# Patient Record
Sex: Male | Born: 1975 | Race: Asian | Hispanic: No | Marital: Married | Smoking: Never smoker
Health system: Southern US, Community
[De-identification: ages and names within clinical notes are randomized; demographics above are authoritative.]

## PROBLEM LIST (undated history)

## (undated) DIAGNOSIS — R7989 Other specified abnormal findings of blood chemistry: Secondary | ICD-10-CM

## (undated) DIAGNOSIS — K219 Gastro-esophageal reflux disease without esophagitis: Secondary | ICD-10-CM

## (undated) DIAGNOSIS — K602 Anal fissure, unspecified: Secondary | ICD-10-CM

## (undated) DIAGNOSIS — E785 Hyperlipidemia, unspecified: Secondary | ICD-10-CM

## (undated) HISTORY — DX: Anal fissure, unspecified: K60.2

## (undated) HISTORY — PX: ESOPHAGOGASTRODUODENOSCOPY: SHX1529

## (undated) HISTORY — PX: NO PAST SURGERIES: SHX2092

## (undated) HISTORY — DX: Other specified abnormal findings of blood chemistry: R79.89

## (undated) HISTORY — DX: Hyperlipidemia, unspecified: E78.5

---

## 2018-02-25 ENCOUNTER — Ambulatory Visit (INDEPENDENT_AMBULATORY_CARE_PROVIDER_SITE_OTHER): Payer: 59 | Admitting: Medical

## 2018-02-25 ENCOUNTER — Encounter: Payer: Self-pay | Admitting: Medical

## 2018-02-25 VITALS — BP 125/90 | HR 72 | Temp 98.2°F | Resp 18 | Ht 66.0 in | Wt 151.6 lb

## 2018-02-25 DIAGNOSIS — Z113 Encounter for screening for infections with a predominantly sexual mode of transmission: Secondary | ICD-10-CM | POA: Diagnosis not present

## 2018-02-25 DIAGNOSIS — E785 Hyperlipidemia, unspecified: Secondary | ICD-10-CM | POA: Diagnosis not present

## 2018-02-25 DIAGNOSIS — E559 Vitamin D deficiency, unspecified: Secondary | ICD-10-CM

## 2018-02-25 DIAGNOSIS — R5383 Other fatigue: Secondary | ICD-10-CM | POA: Diagnosis not present

## 2018-02-25 DIAGNOSIS — Z0000 Encounter for general adult medical examination without abnormal findings: Secondary | ICD-10-CM

## 2018-02-25 NOTE — Patient Instructions (Addendum)
For you wellness exam today I have ordered cbc, cmp, tsh, lipid panel, vit d, ua and hiv.  Vaccine up to date.  Recommend exercise and healthy diet.  We will let you know lab results as they come in.  Follow up date appointment will be determined after lab review.     Preventive Care 40-64 Years, Male Preventive care refers to lifestyle choices and visits with your health care provider that can promote health and wellness. What does preventive care include?   A yearly physical exam. This is also called an annual well check.  Dental exams once or twice a year.  Routine eye exams. Ask your health care provider how often you should have your eyes checked.  Personal lifestyle choices, including: ? Daily care of your teeth and gums. ? Regular physical activity. ? Eating a healthy diet. ? Avoiding tobacco and drug use. ? Limiting alcohol use. ? Practicing safe sex. ? Taking low-dose aspirin every day starting at age 30. What happens during an annual well check? The services and screenings done by your health care provider during your annual well check will depend on your age, overall health, lifestyle risk factors, and family history of disease. Counseling Your health care provider may ask you questions about your:  Alcohol use.  Tobacco use.  Drug use.  Emotional well-being.  Home and relationship well-being.  Sexual activity.  Eating habits.  Work and work Statistician. Screening You may have the following tests or measurements:  Height, weight, and BMI.  Blood pressure.  Lipid and cholesterol levels. These may be checked every 5 years, or more frequently if you are over 65 years old.  Skin check.  Lung cancer screening. You may have this screening every year starting at age 62 if you have a 30-pack-year history of smoking and currently smoke or have quit within the past 15 years.  Colorectal cancer screening. All adults should have this screening starting  at age 25 and continuing until age 74. Your health care provider may recommend screening at age 34. You will have tests every 1-10 years, depending on your results and the type of screening test. People at increased risk should start screening at an earlier age. Screening tests may include: ? Guaiac-based fecal occult blood testing. ? Fecal immunochemical test (FIT). ? Stool DNA test. ? Virtual colonoscopy. ? Sigmoidoscopy. During this test, a flexible tube with a tiny camera (sigmoidoscope) is used to examine your rectum and lower colon. The sigmoidoscope is inserted through your anus into your rectum and lower colon. ? Colonoscopy. During this test, a long, thin, flexible tube with a tiny camera (colonoscope) is used to examine your entire colon and rectum.  Prostate cancer screening. Recommendations will vary depending on your family history and other risks.  Hepatitis C blood test.  Hepatitis B blood test.  Sexually transmitted disease (STD) testing.  Diabetes screening. This is done by checking your blood sugar (glucose) after you have not eaten for a while (fasting). You may have this done every 1-3 years. Discuss your test results, treatment options, and if necessary, the need for more tests with your health care provider. Vaccines Your health care provider may recommend certain vaccines, such as:  Influenza vaccine. This is recommended every year.  Tetanus, diphtheria, and acellular pertussis (Tdap, Td) vaccine. You may need a Td booster every 10 years.  Varicella vaccine. You may need this if you have not been vaccinated.  Zoster vaccine. You may need this after age 35.  Measles, mumps, and rubella (MMR) vaccine. You may need at least one dose of MMR if you were born in 1957 or later. You may also need a second dose.  Pneumococcal 13-valent conjugate (PCV13) vaccine. You may need this if you have certain conditions and have not been vaccinated.  Pneumococcal polysaccharide  (PPSV23) vaccine. You may need one or two doses if you smoke cigarettes or if you have certain conditions.  Meningococcal vaccine. You may need this if you have certain conditions.  Hepatitis A vaccine. You may need this if you have certain conditions or if you travel or work in places where you may be exposed to hepatitis A.  Hepatitis B vaccine. You may need this if you have certain conditions or if you travel or work in places where you may be exposed to hepatitis B.  Haemophilus influenzae type b (Hib) vaccine. You may need this if you have certain risk factors. Talk to your health care provider about which screenings and vaccines you need and how often you need them. This information is not intended to replace advice given to you by your health care provider. Make sure you discuss any questions you have with your health care provider. Document Released: 01/22/2015 Document Revised: 02/15/2017 Document Reviewed: 10/27/2014 Elsevier Interactive Patient Education  2019 [employer].

## 2018-02-25 NOTE — Progress Notes (Signed)
Subjective:    Patient ID: Johnny Riggs, male    DOB: 09-Mar-1975, 43 y.o.   MRN: [id number]  HPI  Pt in for first time.  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 Pt does exercise regularly 3-4 times a week 30 minutes at time.He eats healthy.   Pt states his ldl has been mildly high in past. He would like to get that studies done   Pt also has history of low vitamin D. Pt takes otc vit D gummy a day.   No other PMH other than cholesterol and low vitamin D history.   Review of Systems  Constitutional: Positive for fatigue. Negative for chills and fever.  HENT: Negative for congestion, ear discharge, ear pain, facial swelling, postnasal drip, sinus pressure and sinus pain.   Respiratory: Negative for cough, chest tightness, shortness of breath and wheezing.   Cardiovascular: Negative for chest pain and palpitations.  Gastrointestinal: Negative for abdominal pain, constipation, diarrhea and vomiting.  Genitourinary: Negative for decreased urine volume, difficulty urinating, dysuria, flank pain, frequency, penile swelling and urgency.  Musculoskeletal: Negative for back pain, joint swelling and neck stiffness.  Skin: Negative for rash.  Neurological: Negative for dizziness, speech difficulty, weakness and headaches.  Hematological: Negative for adenopathy. Does not bruise/bleed easily.  Psychiatric/Behavioral: Negative for behavioral problems, confusion and dysphoric mood.    No past medical history on file.   Social History   Socioeconomic History  . Marital status: Married    Spouse name: Not on file  . Number of children: Not on file  . Years of education: Not on file  . Highest education level: Not on file  Occupational History  . Not on file  Social Needs  . Financial resource strain: Not on file  . Food insecurity:    Worry: Not on file    Inability: Not on file  . Transportation needs:    Medical: Not on file    Non-medical: Not on file  Tobacco Use  . Smoking  status: Not on file  Substance and Sexual Activity  . Alcohol use: Not on file  . Drug use: Not on file  . Sexual activity: Not on file  Lifestyle  . Physical activity:    Days per week: Not on file    Minutes per session: Not on file  . Stress: Not on file  Relationships  . Social connections:    Talks on phone: Not on file    Gets together: Not on file    Attends religious service: Not on file    Active member of club or organization: Not on file    Attends meetings of clubs or organizations: Not on file    Relationship status: Not on file  . Intimate partner violence:    Fear of current or ex partner: Not on file    Emotionally abused: Not on file    Physically abused: Not on file    Forced sexual activity: Not on file  Other Topics Concern  . Not on file  Social History Narrative  . Not on file    No family history on file.  Allergies not on file  No current outpatient medications on file prior to visit.   No current facility-administered medications on file prior to visit.     BP 125/90 (BP Location: Left Arm, Patient Position: Sitting, Cuff Size: Normal)   Pulse 72   Temp 98.2 F (36.8 C) (Oral)   Resp 18   Ht 5'  6" (1.676 m)   Wt 151 lb 9.6 oz (68.8 kg)   SpO2 100%   BMI 24.47 kg/m       Objective:   Physical Exam  General Mental Status- Alert. General Appearance- Not in acute distress.   Skin General: Color- Normal Color. Moisture- Normal Moisture. Very few small scattered moles on anterior and posterior thorax.  One skin tag left of mid thoracic region.(Patient states is been there for years since youth.)  Neck Normal range of motion.  No lymphadenopathy.  No supraclavicular nodes felt.  Chest and Lung Exam Auscultation: Breath Sounds:-Normal.  Cardiovascular Auscultation:Rythm- Regular. Murmurs & Other Heart Sounds:Auscultation of the heart reveals- No Murmurs.  Abdomen Inspection:-Inspeection Normal. Palpation/Percussion:Note:No  mass. Palpation and Percussion of the abdomen reveal- Non Tender, Non Distended + BS, no rebound or guarding.  Neurologic Cranial Nerve exam:- CN III-XII intact(No nystagmus), symmetric smile. Strength:- 5/5 equal and symmetric strength both upper and lower extremities.    Assessment & Plan:  For you wellness exam today I have ordered cbc, cmp, tsh, lipid panel, vitamin  ua and hiv.(Explained to patient to schedule appointment for tomorrow morning to get labs since he was not fasting today.)  Vaccine up to date.  Recommend exercise and healthy diet.  We will let you know lab results as they come in.  Follow up date appointment will be determined after lab review.

## 2018-02-26 ENCOUNTER — Other Ambulatory Visit (INDEPENDENT_AMBULATORY_CARE_PROVIDER_SITE_OTHER): Payer: 59

## 2018-02-26 DIAGNOSIS — Z113 Encounter for screening for infections with a predominantly sexual mode of transmission: Secondary | ICD-10-CM

## 2018-02-26 DIAGNOSIS — Z0000 Encounter for general adult medical examination without abnormal findings: Secondary | ICD-10-CM

## 2018-02-26 DIAGNOSIS — E559 Vitamin D deficiency, unspecified: Secondary | ICD-10-CM | POA: Diagnosis not present

## 2018-02-26 DIAGNOSIS — R5383 Other fatigue: Secondary | ICD-10-CM

## 2018-02-26 LAB — TSH: TSH: 1.9 u[IU]/mL (ref 0.35–4.50)

## 2018-02-26 LAB — POC URINALSYSI DIPSTICK (AUTOMATED)
Bilirubin, UA: NEGATIVE
Blood, UA: NEGATIVE
Glucose, UA: NEGATIVE
Ketones, UA: NEGATIVE
Leukocytes, UA: NEGATIVE
Nitrite, UA: NEGATIVE
PH UA: 6 (ref 5.0–8.0)
PROTEIN UA: NEGATIVE
Spec Grav, UA: 1.015 (ref 1.010–1.025)
Urobilinogen, UA: 0.2 E.U./dL

## 2018-02-26 LAB — LIPID PANEL
Cholesterol: 194 mg/dL (ref 0–200)
HDL: 36.7 mg/dL — ABNORMAL LOW (ref >39.00)
LDL Cholesterol: 135 mg/dL — ABNORMAL HIGH (ref 0–99)
NonHDL: 157.54
Total CHOL/HDL Ratio: 5
Triglycerides: 112 mg/dL (ref 0.0–149.0)
VLDL: 22.4 mg/dL (ref 0.0–40.0)

## 2018-02-26 LAB — CBC WITH DIFFERENTIAL/PLATELET
BASOS ABS: 0 10*3/uL (ref 0.0–0.1)
Basophils Relative: 0.8 % (ref 0.0–3.0)
Eosinophils Absolute: 0.1 10*3/uL (ref 0.0–0.7)
Eosinophils Relative: 1.4 % (ref 0.0–5.0)
HCT: 45 % (ref 39.0–52.0)
Hemoglobin: 15 g/dL (ref 13.0–17.0)
Lymphocytes Relative: 25.5 % (ref 12.0–46.0)
Lymphs Abs: 1.5 10*3/uL (ref 0.7–4.0)
MCHC: 33.3 g/dL (ref 30.0–36.0)
MCV: 87.6 fl (ref 78.0–100.0)
MONO ABS: 0.4 10*3/uL (ref 0.1–1.0)
Monocytes Relative: 7.3 % (ref 3.0–12.0)
Neutro Abs: 3.7 10*3/uL (ref 1.4–7.7)
Neutrophils Relative %: 65 % (ref 43.0–77.0)
Platelets: 287 10*3/uL (ref 150.0–400.0)
RBC: 5.14 Mil/uL (ref 4.22–5.81)
RDW: 12.9 % (ref 11.5–15.5)
WBC: 5.7 10*3/uL (ref 4.0–10.5)

## 2018-02-26 LAB — COMPREHENSIVE METABOLIC PANEL
ALT: 12 U/L (ref 0–53)
AST: 14 U/L (ref 0–37)
Albumin: 4.4 g/dL (ref 3.5–5.2)
Alkaline Phosphatase: 74 U/L (ref 39–117)
BUN: 17 mg/dL (ref 6–23)
CO2: 32 meq/L (ref 19–32)
Calcium: 9.5 mg/dL (ref 8.4–10.5)
Chloride: 104 mEq/L (ref 96–112)
Creatinine, Ser: 0.92 mg/dL (ref 0.40–1.50)
GFR: 89.2 mL/min (ref >60.00)
Glucose, Bld: 90 mg/dL (ref 70–99)
Potassium: 4.4 mEq/L (ref 3.5–5.1)
Sodium: 144 mEq/L (ref 135–145)
Total Bilirubin: 0.8 mg/dL (ref 0.2–1.2)
Total Protein: 6.9 g/dL (ref 6.0–8.3)

## 2018-02-26 LAB — VITAMIN D 25 HYDROXY (VIT D DEFICIENCY, FRACTURES): VITD: 18.54 ng/mL — ABNORMAL LOW (ref 30.00–100.00)

## 2018-02-27 ENCOUNTER — Encounter: Payer: Self-pay | Admitting: Medical

## 2018-02-27 LAB — HIV ANTIBODY (ROUTINE TESTING W REFLEX): HIV: NONREACTIVE

## 2018-03-08 ENCOUNTER — Ambulatory Visit: Payer: Self-pay | Admitting: Surgery

## 2018-03-08 ENCOUNTER — Encounter (HOSPITAL_BASED_OUTPATIENT_CLINIC_OR_DEPARTMENT_OTHER): Payer: Self-pay | Admitting: *Deleted

## 2018-03-08 ENCOUNTER — Other Ambulatory Visit: Payer: Self-pay

## 2018-03-08 DIAGNOSIS — N5089 Other specified disorders of the male genital organs: Secondary | ICD-10-CM | POA: Diagnosis not present

## 2018-03-08 NOTE — H&P (Signed)
Surgical H&P  CC: perineal cyst  HPI: 43 year old male with history of hyperlipidemia and low vitamin D, who is self-referred for a lesion on the right medial buttock/ perineum. He states that there has been a cyst in this location for several years which would occasionally become swollen and painful about the dimensions of the peanut, but he felt that it was a benign lesion and therefore did not pay much attention to it. For the last month or so, it has been increasingly frequently flaring and intermittently draining in addition to which this area causes him some pruritus. He is interested in options for excision. No prior perianal procedures.  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 Denies tobacco, alcohol or drug use.  Recent labs for physical were unremarkable aside from previously mentioned elevated LDL and low vitamin D.  NKDA  Past Medical History:  Diagnosis Date  . Hyperlipidemia   . Low vitamin D level     No past surgical history on file.  No family history on file.  Social History   Socioeconomic History  . Marital status: Married    Spouse name: Not on file  . Number of children: Not on file  . Years of education: Not on file  . Highest education level: Not on file  Occupational History  . Occupation: hospitalist  Social Needs  . Financial resource strain: Not on file  . Food insecurity:    Worry: Not on file    Inability: Not on file  . Transportation needs:    Medical: Not on file    Non-medical: Not on file  Tobacco Use  . Smoking status: Never Smoker  . Smokeless tobacco: Never Used  Substance and Sexual Activity  . Alcohol use: Not Currently  . Drug use: Never  . Sexual activity: Yes  Lifestyle  . Physical activity:    Days per week: Not on file    Minutes per session: Not on file  . Stress: Not on file  Relationships  . Social connections:    Talks on phone: Not on file    Gets together: Not on file    Attends religious service: Not on file   Active member of club or organization: Not on file    Attends meetings of clubs or organizations: Not on file    Relationship status: Not on file  Other Topics Concern  . Not on file  Social History Narrative  . Not on file    No current outpatient medications on file prior to visit.   No current facility-administered medications on file prior to visit.     Review of Systems: a complete, 10pt review of systems was completed with pertinent positives and negatives as documented in the HPI  Physical Exam:  03/08/2018 2:38 PM Weight: 151 lb Height: 66in Body Surface Area: 1.77 m Body Mass Index: 24.37 kg/m  Temp.: 98.67F  Pulse: 84 (Regular)  BP: 122/84 (Sitting, Left Arm, Standard)  Gen: alert and well appearing Eye: extraocular motion intact, no scleral icterus ENT: moist mucus membranes, dentition intact Chest: unlabored respirations Neuro: grossly intact, normal gait Psych: normal mood and affect, appropriate insight Skin: warm and dry Rectal: On the right side of the perineum just posterior to the perineal body there is a approximately 7 mm area of flat pink scar, no palpable underlying cyst or fistulous tract*   CBC Latest Ref Rng & Units 02/26/2018  WBC 4.0 - 10.5 K/uL 5.7  Hemoglobin 13.0 - 17.0 g/dL 15.0  Hematocrit  39.0 - 52.0 % 45.0  Platelets 150.0 - 400.0 K/uL 287.0    CMP Latest Ref Rng & Units 02/26/2018  Glucose 70 - 99 mg/dL 90  BUN 6 - 23 mg/dL 17  Creatinine 0.40 - 1.50 mg/dL 0.92  Sodium 135 - 145 mEq/L 144  Potassium 3.5 - 5.1 mEq/L 4.4  Chloride 96 - 112 mEq/L 104  CO2 19 - 32 mEq/L 32  Calcium 8.4 - 10.5 mg/dL 9.5  Total Protein 6.0 - 8.3 g/dL 6.9  Total Bilirubin 0.2 - 1.2 mg/dL 0.8  Alkaline Phos 39 - 117 U/L 74  AST 0 - 37 U/L 14  ALT 0 - 53 U/L 12    No results found for: INR, PROTIME  Imaging: No results found.    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 We discussed  risks of bleeding, infection, pain, scarring, wound healing problems, and recurrence. Questions were welcomed answered to his satisfaction. Will plan to schedule excision under MAC.   Romana Juniper, MD [facility], [place] Pager [phone]

## 2018-03-08 NOTE — H&P (View-Only) (Signed)
Surgical H&P  CC: perineal cyst  HPI: 43-year-old male with history of hyperlipidemia and low vitamin D, who is self-referred for a lesion on the right medial buttock/ perineum. He states that there has been a cyst in this location for several years which would occasionally become swollen and painful about the dimensions of the peanut, but he felt that it was a benign lesion and therefore did not pay much attention to it. For the last month or so, it has been increasingly frequently flaring and intermittently draining in addition to which this area causes him some pruritus. He is interested in options for excision. No prior perianal procedures.  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  Recent labs for physical were unremarkable aside from previously mentioned elevated LDL and low vitamin D.  NKDA  Past Medical History:  Diagnosis Date  . Hyperlipidemia   . Low vitamin D level     No past surgical history on file.  No family history on file.  Social History   Socioeconomic History  . Marital status: Married    Spouse name: Not on file  . Number of children: Not on file  . Years of education: Not on file  . Highest education level: Not on file  Occupational History  . Occupation: hospitalist  Social Needs  . Financial resource strain: Not on file  . Food insecurity:    Worry: Not on file    Inability: Not on file  . Transportation needs:    Medical: Not on file    Non-medical: Not on file  Tobacco Use  . Smoking status: Never Smoker  . Smokeless tobacco: Never Used  Substance and Sexual Activity  . Alcohol use: Not Currently  . Drug use: Never  . Sexual activity: Yes  Lifestyle  . Physical activity:    Days per week: Not on file    Minutes per session: Not on file  . Stress: Not on file  Relationships  . Social connections:    Talks on phone: Not on file    Gets together: Not on file    Attends religious service: Not on file   Active member of club or organization: Not on file    Attends meetings of clubs or organizations: Not on file    Relationship status: Not on file  Other Topics Concern  . Not on file  Social History Narrative  . Not on file    No current outpatient medications on file prior to visit.   No current facility-administered medications on file prior to visit.     Review of Systems: a complete, 10pt review of systems was completed with pertinent positives and negatives as documented in the HPI  Physical Exam:  03/08/2018 2:38 PM Weight: 151 lb Height: 66in Body Surface Area: 1.77 m Body Mass Index: 24.37 kg/m  Temp.: 98.1F  Pulse: 84 (Regular)  BP: 122/84 (Sitting, Left Arm, Standard)  Gen: alert and well appearing Eye: extraocular motion intact, no scleral icterus ENT: moist mucus membranes, dentition intact Chest: unlabored respirations Neuro: grossly intact, normal gait Psych: normal mood and affect, appropriate insight Skin: warm and dry Rectal: On the right side of the perineum just posterior to the perineal body there is a approximately 7 mm area of flat pink scar, no palpable underlying cyst or fistulous tract*   CBC Latest Ref Rng & Units 02/26/2018  WBC 4.0 - 10.5 K/uL 5.7  Hemoglobin 13.0 - 17.0 g/dL 15.0  Hematocrit   39.0 - 52.0 % 45.0  Platelets 150.0 - 400.0 K/uL 287.0    CMP Latest Ref Rng & Units 02/26/2018  Glucose 70 - 99 mg/dL 90  BUN 6 - 23 mg/dL 17  Creatinine 0.40 - 1.50 mg/dL 0.92  Sodium 135 - 145 mEq/L 144  Potassium 3.5 - 5.1 mEq/L 4.4  Chloride 96 - 112 mEq/L 104  CO2 19 - 32 mEq/L 32  Calcium 8.4 - 10.5 mg/dL 9.5  Total Protein 6.0 - 8.3 g/dL 6.9  Total Bilirubin 0.2 - 1.2 mg/dL 0.8  Alkaline Phos 39 - 117 U/L 74  AST 0 - 37 U/L 14  ALT 0 - 53 U/L 12    No results found for: INR, PROTIME  Imaging: No results found.    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 We discussed  risks of bleeding, infection, pain, scarring, wound healing problems, and recurrence. Questions were welcomed answered to his satisfaction. Will plan to schedule excision under MAC.   Ramah Langhans, MD [facility] [place] Pager [phone]  

## 2018-03-11 ENCOUNTER — Encounter (HOSPITAL_BASED_OUTPATIENT_CLINIC_OR_DEPARTMENT_OTHER)
Admission: RE | Disposition: A | Discharge status: Home / Self Care | Payer: Self-pay | Source: Home / Self Care | Attending: Surgery

## 2018-03-11 ENCOUNTER — Ambulatory Visit (HOSPITAL_BASED_OUTPATIENT_CLINIC_OR_DEPARTMENT_OTHER)
Admission: RE | Admit: 2018-03-11 | Discharge: 2018-03-11 | Disposition: A | Discharge status: Home / Self Care | Payer: 59 | Attending: Surgery | Admitting: Surgery

## 2018-03-11 ENCOUNTER — Ambulatory Visit (HOSPITAL_BASED_OUTPATIENT_CLINIC_OR_DEPARTMENT_OTHER): Payer: 59 | Admitting: Anesthesiology

## 2018-03-11 ENCOUNTER — Other Ambulatory Visit: Payer: Self-pay

## 2018-03-11 ENCOUNTER — Encounter (HOSPITAL_BASED_OUTPATIENT_CLINIC_OR_DEPARTMENT_OTHER): Payer: Self-pay | Admitting: Anesthesiology

## 2018-03-11 DIAGNOSIS — N907 Vulvar cyst: Secondary | ICD-10-CM | POA: Diagnosis not present

## 2018-03-11 DIAGNOSIS — L02215 Cutaneous abscess of perineum: Secondary | ICD-10-CM | POA: Insufficient documentation

## 2018-03-11 DIAGNOSIS — E785 Hyperlipidemia, unspecified: Secondary | ICD-10-CM | POA: Insufficient documentation

## 2018-03-11 DIAGNOSIS — L728 Other follicular cysts of the skin and subcutaneous tissue: Secondary | ICD-10-CM | POA: Diagnosis present

## 2018-03-11 HISTORY — PX: CYST EXCISION: SHX5701

## 2018-03-11 SURGERY — CYST REMOVAL
Anesthesia: Monitor Anesthesia Care | Site: Groin

## 2018-03-11 MED ORDER — FENTANYL CITRATE (PF) 100 MCG/2ML IJ SOLN: 25.0000 ug | INTRAMUSCULAR | Status: DC | PRN

## 2018-03-11 MED ORDER — PROPOFOL 500 MG/50ML IV EMUL
INTRAVENOUS | Status: AC
  Filled 2018-03-11: qty 50

## 2018-03-11 MED ORDER — LACTATED RINGERS IV SOLN
INTRAVENOUS | Status: DC
  Administered 2018-03-11: 09:00:00 via INTRAVENOUS

## 2018-03-11 MED ORDER — TRAMADOL HCL 50 MG PO TABS: 50.0000 mg | ORAL_TABLET | Freq: Four times a day (QID) | ORAL | 0 refills | Status: DC | PRN

## 2018-03-11 MED ORDER — CHLORHEXIDINE GLUCONATE 4 % EX LIQD: 60.0000 mL | Freq: Once | CUTANEOUS | Status: DC

## 2018-03-11 MED ORDER — MIDAZOLAM HCL 2 MG/2ML IJ SOLN
INTRAMUSCULAR | Status: AC
  Filled 2018-03-11: qty 2

## 2018-03-11 MED ORDER — SODIUM CHLORIDE 0.9% FLUSH: 3.0000 mL | INTRAVENOUS | Status: DC | PRN

## 2018-03-11 MED ORDER — ACETAMINOPHEN 500 MG PO TABS
ORAL_TABLET | ORAL | Status: AC
  Filled 2018-03-11: qty 2

## 2018-03-11 MED ORDER — PROPOFOL 500 MG/50ML IV EMUL
INTRAVENOUS | Status: DC | PRN
  Administered 2018-03-11: 100 ug/kg/min via INTRAVENOUS

## 2018-03-11 MED ORDER — MEPERIDINE HCL 25 MG/ML IJ SOLN: 6.2500 mg | INTRAMUSCULAR | Status: DC | PRN

## 2018-03-11 MED ORDER — SODIUM CHLORIDE 0.9% FLUSH: 3.0000 mL | Freq: Two times a day (BID) | INTRAVENOUS | Status: DC

## 2018-03-11 MED ORDER — BUPIVACAINE HCL (PF) 0.25 % IJ SOLN
INTRAMUSCULAR | Status: DC | PRN
  Administered 2018-03-11: 6 mL

## 2018-03-11 MED ORDER — LIDOCAINE HCL (PF) 1 % IJ SOLN
INTRAMUSCULAR | Status: AC
  Filled 2018-03-11: qty 30

## 2018-03-11 MED ORDER — CEFAZOLIN SODIUM-DEXTROSE 2-4 GM/100ML-% IV SOLN
2.0000 g | INTRAVENOUS | Status: AC
  Administered 2018-03-11: 2 g via INTRAVENOUS

## 2018-03-11 MED ORDER — ACETAMINOPHEN 325 MG PO TABS: 650.0000 mg | ORAL_TABLET | Freq: Four times a day (QID) | ORAL | 2 refills | Status: DC | PRN

## 2018-03-11 MED ORDER — ONDANSETRON HCL 4 MG/2ML IJ SOLN
INTRAMUSCULAR | Status: AC
  Filled 2018-03-11: qty 2

## 2018-03-11 MED ORDER — FENTANYL CITRATE (PF) 100 MCG/2ML IJ SOLN
INTRAMUSCULAR | Status: AC
  Filled 2018-03-11: qty 2

## 2018-03-11 MED ORDER — SODIUM CHLORIDE 0.9 % IV SOLN: 250.0000 mL | INTRAVENOUS | Status: DC | PRN

## 2018-03-11 MED ORDER — IBUPROFEN 200 MG PO TABS: 200.0000 mg | ORAL_TABLET | Freq: Four times a day (QID) | ORAL | 2 refills | Status: DC | PRN

## 2018-03-11 MED ORDER — LIDOCAINE 2% (20 MG/ML) 5 ML SYRINGE
INTRAMUSCULAR | Status: DC | PRN
  Administered 2018-03-11: 40 mg via INTRAVENOUS

## 2018-03-11 MED ORDER — ACETAMINOPHEN 500 MG PO TABS
1000.0000 mg | ORAL_TABLET | ORAL | Status: AC
  Administered 2018-03-11: 1000 mg via ORAL

## 2018-03-11 MED ORDER — OXYCODONE HCL 5 MG PO TABS: 5.0000 mg | ORAL_TABLET | ORAL | Status: DC | PRN

## 2018-03-11 MED ORDER — FENTANYL CITRATE (PF) 100 MCG/2ML IJ SOLN
50.0000 ug | INTRAMUSCULAR | Status: DC | PRN
  Administered 2018-03-11: 100 ug via INTRAVENOUS

## 2018-03-11 MED ORDER — ONDANSETRON HCL 4 MG/2ML IJ SOLN: 4.0000 mg | Freq: Once | INTRAMUSCULAR | Status: DC | PRN

## 2018-03-11 MED ORDER — ONDANSETRON HCL 4 MG/2ML IJ SOLN
INTRAMUSCULAR | Status: DC | PRN
  Administered 2018-03-11: 4 mg via INTRAVENOUS

## 2018-03-11 MED ORDER — MIDAZOLAM HCL 2 MG/2ML IJ SOLN
1.0000 mg | INTRAMUSCULAR | Status: DC | PRN
  Administered 2018-03-11: 2 mg via INTRAVENOUS

## 2018-03-11 MED ORDER — ACETAMINOPHEN 325 MG PO TABS: 650.0000 mg | ORAL_TABLET | ORAL | Status: DC | PRN

## 2018-03-11 MED ORDER — SCOPOLAMINE 1 MG/3DAYS TD PT72: 1.0000 | MEDICATED_PATCH | Freq: Once | TRANSDERMAL | Status: DC | PRN

## 2018-03-11 MED ORDER — CEFAZOLIN SODIUM-DEXTROSE 2-4 GM/100ML-% IV SOLN
INTRAVENOUS | Status: AC
  Filled 2018-03-11: qty 100

## 2018-03-11 MED ORDER — ACETAMINOPHEN 650 MG RE SUPP: 650.0000 mg | RECTAL | Status: DC | PRN

## 2018-03-11 MED FILL — traMADol HCL 50 MG TABS: 50 | 3 days supply | Qty: 10 | Fill #0

## 2018-03-11 SURGICAL SUPPLY — 52 items
BLADE CLIPPER SURG (BLADE) IMPLANT
BLADE SURG 15 STRL LF DISP TIS (BLADE) ×1 IMPLANT
BLADE SURG 15 STRL SS (BLADE) ×2
CANISTER SUCT 1200ML W/VALVE (MISCELLANEOUS) IMPLANT
CHLORAPREP W/TINT 26ML (MISCELLANEOUS) ×3 IMPLANT
CLOSURE STERI-STRIP 1/2X4 (GAUZE/BANDAGES/DRESSINGS)
CLSR STERI-STRIP ANTIMIC 1/2X4 (GAUZE/BANDAGES/DRESSINGS) IMPLANT
COVER MAYO STAND STRL (DRAPES) IMPLANT
COVER WAND RF STERILE (DRAPES) IMPLANT
DECANTER SPIKE VIAL GLASS SM (MISCELLANEOUS) IMPLANT
DERMABOND ADVANCED (GAUZE/BANDAGES/DRESSINGS)
DERMABOND ADVANCED .7 DNX12 (GAUZE/BANDAGES/DRESSINGS) IMPLANT
DRAPE UTILITY XL STRL (DRAPES) IMPLANT
ELECT COATED BLADE 2.86 ST (ELECTRODE) ×3 IMPLANT
ELECT REM PT RETURN 9FT ADLT (ELECTROSURGICAL) ×3
ELECTRODE REM PT RTRN 9FT ADLT (ELECTROSURGICAL) ×1 IMPLANT
GAUZE PACKING IODOFORM 1/4X15 (GAUZE/BANDAGES/DRESSINGS) IMPLANT
GAUZE SPONGE 4X4 12PLY STRL LF (GAUZE/BANDAGES/DRESSINGS) IMPLANT
GLOVE BIO SURGEON STRL SZ 6 (GLOVE) ×3 IMPLANT
GLOVE BIO SURGEON STRL SZ 6.5 (GLOVE) ×2 IMPLANT
GLOVE BIO SURGEONS STRL SZ 6.5 (GLOVE) ×1
GLOVE BIOGEL PI IND STRL 6.5 (GLOVE) ×1 IMPLANT
GLOVE BIOGEL PI IND STRL 7.0 (GLOVE) ×2 IMPLANT
GLOVE BIOGEL PI INDICATOR 6.5 (GLOVE) ×2
GLOVE BIOGEL PI INDICATOR 7.0 (GLOVE) ×4
GOWN STRL REUS W/ TWL LRG LVL3 (GOWN DISPOSABLE) ×2 IMPLANT
GOWN STRL REUS W/TWL LRG LVL3 (GOWN DISPOSABLE) ×4
HIBICLENS CHG 4% 4OZ BTL (MISCELLANEOUS) IMPLANT
NEEDLE HYPO 25X1 1.5 SAFETY (NEEDLE) ×3 IMPLANT
NS IRRIG 1000ML POUR BTL (IV SOLUTION) IMPLANT
PACK BASIN DAY SURGERY FS (CUSTOM PROCEDURE TRAY) ×3 IMPLANT
PACK LITHOTOMY IV (CUSTOM PROCEDURE TRAY) ×3 IMPLANT
PENCIL BUTTON HOLSTER BLD 10FT (ELECTRODE) ×3 IMPLANT
SLEEVE SCD COMPRESS KNEE MED (MISCELLANEOUS) ×3 IMPLANT
SUT ETHILON 2 0 FS 18 (SUTURE) IMPLANT
SUT MNCRL AB 4-0 PS2 18 (SUTURE) IMPLANT
SUT SILK 2 0 SH (SUTURE) IMPLANT
SUT VIC AB 2-0 SH 27 (SUTURE)
SUT VIC AB 2-0 SH 27XBRD (SUTURE) IMPLANT
SUT VIC AB 3-0 SH 27 (SUTURE) ×2
SUT VIC AB 3-0 SH 27X BRD (SUTURE) ×1 IMPLANT
SUT VICRYL 3-0 CR8 SH (SUTURE) IMPLANT
SUT VICRYL 4-0 PS2 18IN ABS (SUTURE) IMPLANT
SWAB COLLECTION DEVICE MRSA (MISCELLANEOUS) IMPLANT
SWAB CULTURE ESWAB REG 1ML (MISCELLANEOUS) IMPLANT
SYR CONTROL 10ML LL (SYRINGE) ×3 IMPLANT
TOWEL GREEN STERILE FF (TOWEL DISPOSABLE) ×3 IMPLANT
TRAY DSU PREP LF (CUSTOM PROCEDURE TRAY) IMPLANT
TUBE CONNECTING 20'X1/4 (TUBING) ×1
TUBE CONNECTING 20X1/4 (TUBING) ×2 IMPLANT
UNDERPAD 30X30 (UNDERPADS AND DIAPERS) IMPLANT
YANKAUER SUCT BULB TIP NO VENT (SUCTIONS) ×3 IMPLANT

## 2018-03-11 NOTE — Anesthesia Postprocedure Evaluation (Signed)
Anesthesia Post Note  Patient: Johnny Riggs  Procedure(s) Performed: EXCISION OF PERINEAL CYST (N/A Groin)     Patient location during evaluation: PACU Anesthesia Type: MAC Level of consciousness: awake and alert and oriented Pain management: pain level controlled Vital Signs Assessment: post-procedure vital signs reviewed and stable Respiratory status: spontaneous breathing, nonlabored ventilation and respiratory function stable Cardiovascular status: stable and blood pressure returned to baseline Postop Assessment: no apparent nausea or vomiting Anesthetic complications: no    Last Vitals:  Vitals:   03/11/18 1100 03/11/18 1116  BP: 119/88 (!) 124/96  Pulse: 79 70  Resp: 17 14  Temp:    SpO2: 99% 99%    Last Pain:  Vitals:   03/11/18 1116  TempSrc:   PainSc: 0-No pain                 Kamani Magnussen A.

## 2018-03-11 NOTE — Transfer of Care (Signed)
Immediate Anesthesia Transfer of Care Note  Patient: Johnny Riggs  Procedure(s) Performed: EXCISION OF PERINEAL CYST (N/A Groin)  Patient Location: PACU  Anesthesia Type:MAC  Level of Consciousness: sedated  Airway & Oxygen Therapy: Patient Spontanous Breathing  Post-op Assessment: Report given to RN and Post -op Vital signs reviewed and stable  Post vital signs: Reviewed and stable  Last Vitals:  Vitals Value Taken Time  BP 115/79 03/11/2018 10:50 AM  Temp    Pulse 83 03/11/2018 10:52 AM  Resp 18 03/11/2018 10:52 AM  SpO2 100 % 03/11/2018 10:52 AM  Vitals shown include unvalidated device data.  Last Pain:  Vitals:   03/11/18 0910  TempSrc: Oral  PainSc: 0-No pain         Complications: No apparent anesthesia complications

## 2018-03-11 NOTE — Interval H&P Note (Signed)
History and Physical Interval Note:  03/11/2018 9:38 AM  Johnny Riggs  has presented today for surgery, with the diagnosis of perineal cyst  The various methods of treatment have been discussed with the patient and family. After consideration of risks, benefits and other options for treatment, the patient has consented to  Procedure(s): EXCISION OF PERINEAL CYST (N/A) as a surgical intervention .  The patient's history has been reviewed, patient examined, no change in status, stable for surgery.  I have reviewed the patient's chart and labs.  Questions were answered to the patient's satisfaction.     Chelsea Rich Brave

## 2018-03-11 NOTE — Anesthesia Preprocedure Evaluation (Signed)
Anesthesia Evaluation  Patient identified by MRN, date of birth, ID band Patient awake    Reviewed: Allergy & Precautions, NPO status , Patient's Chart, lab work & pertinent test results  Airway Mallampati: II  TM Distance: >3 FB Neck ROM: Full    Dental  (+) Teeth Intact   Pulmonary neg pulmonary ROS,    Pulmonary exam normal breath sounds clear to auscultation       Cardiovascular negative cardio ROS Normal cardiovascular exam Rhythm:Regular Rate:Normal     Neuro/Psych negative neurological ROS  negative psych ROS   GI/Hepatic negative GI ROS, Neg liver ROS,   Endo/Other  Hyperlipidemia Hx/o Vit. D deficiency  Renal/GU negative Renal ROS   Perineal cyst    Musculoskeletal negative musculoskeletal ROS (+)   Abdominal (+) - obese,   Peds  Hematology negative hematology ROS (+)   Anesthesia Other Findings   Reproductive/Obstetrics                             Anesthesia Physical Anesthesia Plan  ASA: II  Anesthesia Plan: MAC   Post-op Pain Management:    Induction: Intravenous  PONV Risk Score and Plan: 1 and Ondansetron, Propofol infusion and Treatment may vary due to age or medical condition  Airway Management Planned: Natural Airway, Simple Face Mask and Nasal Cannula  Additional Equipment:   Intra-op Plan:   Post-operative Plan:   Informed Consent: I have reviewed the patients History and Physical, chart, labs and discussed the procedure including the risks, benefits and alternatives for the proposed anesthesia with the patient or authorized representative who has indicated his/her understanding and acceptance.     Dental advisory given  Plan Discussed with: CRNA and Surgeon  Anesthesia Plan Comments:         Anesthesia Quick Evaluation

## 2018-03-11 NOTE — Op Note (Signed)
Operative Note  Johnny Riggs  [id number]  [id number]  03/11/2018   Surgeon: Vikki Ports A ConnorMD  Assistant: none  Procedure performed: excision of perineal cyst  Preop diagnosis: perineal cyst Post-op diagnosis/intraop findings: chronic abscess  Specimens: perineal cyst Retained items: none EBL: minimal cc Complications: none  Description of procedure: After obtaining informed consent the patient was taken to the operating room and placed in lithotomy with all pressure points appropriately padded on the operating room table [facility] was initiated, preoperative antibiotics were administered, SCDs applied, and a formal timeout was performed. The perineum was prepped and draped in the usual sterile fashion. The lesion sits about 3 cm to the right of midline anterior to the anus and posterior to the perineal body. There is a palpable cord extending from the region of chronic scar medially towards the perineal body. After infiltration with local, a transverse elliptical incision was made around the chronic scar on the skin extending medially and then cautery dissection was used to divide the dermis and dissect out this chronic subcutaneous process. As the dissection approached the sphincter complex medially, the cyst was opened and its lumen which consisted of chronic inflammatory granulation tissue was probed with a hemostat while the adjacent anal canal and rectum were inspected. This seemed to terminate within the subcutaneous tissues and did not communicate with the rectum or anal canal. The inflammatory tissue was excised and the remaining area medially was fulgurated with cautery. Hemostasis was ensured within the wound. The incision was then closed with interrupted deep dermal 30 Vicryls and interrupted simple interrupted 3-0 chromic's. A dry gauze dressing was applied. The patient was then awakened, return to the supine positionand taken to PACU in stable condition.   All counts  were correct at the completion of the case.

## 2018-03-11 NOTE — Discharge Instructions (Signed)
GENERAL SURGERY: POST OP INSTRUCTIONS  ######################################################################  EAT Gradually transition to a high fiber diet with a fiber supplement over the next few weeks after discharge.  Start with a pureed / full liquid diet (see below)  WALK Walk an hour a day.  Control your pain to do that.    CONTROL PAIN Control pain so that you can walk, sleep, tolerate sneezing/coughing, go up/down stairs.  HAVE A BOWEL MOVEMENT DAILY Keep your bowels regular to avoid problems.  OK to try a laxative to override constipation.  OK to use an antidairrheal to slow down diarrhea.  Call if not better after 2 tries  CALL IF YOU HAVE PROBLEMS/CONCERNS Call if you are still struggling despite following these instructions. Call if you have concerns not answered by these instructions  ######################################################################    1. DIET: Follow a light bland diet the first 24 hours after arrival home, such as soup, liquids, crackers, etc.  Be sure to include lots of fluids daily.  Avoid fast food or heavy meals as your are more likely to get nauseated.   2. Take your usually prescribed home medications unless otherwise directed. 3. PAIN CONTROL: a. Pain is best controlled by a usual combination of three different methods TOGETHER: i. Ice/Heat ii. Over the counter pain medication iii. Prescription pain medication b. Most patients will experience some swelling and bruising around the incisions.  Ice packs or heating pads (30-60 minutes up to 6 times a day) will help. Use ice for the first few days to help decrease swelling and bruising, then switch to heat to help relax tight/sore spots and speed recovery.  Some people prefer to use ice alone, heat alone, alternating between ice & heat.  Experiment to what works for you.  Swelling and bruising can take several weeks to resolve.   c. It is helpful to take an over-the-counter pain medication  regularly for the first few weeks.  Choose one of the following that works best for you: i. Naproxen (Aleve, etc)  Two 220mg  tabs twice a day ii. Ibuprofen (Advil, etc) Three 200mg  tabs four times a day (every meal & bedtime) iii. Acetaminophen (Tylenol, etc) 500-650mg  four times a day (every meal & bedtime) d. A  prescription for pain medication (such as oxycodone, hydrocodone, etc) should be given to you upon discharge.  Take your pain medication as prescribed- IF NEEDED.  i. If you are having problems/concerns with the prescription medicine (does not control pain, nausea, vomiting, rash, itching, etc), please call us [phone] to see if we need to switch you to a different pain medicine that will work better for you and/or control your side effect better. ii. If you need a refill on your pain medication, please contact your pharmacy.  They will contact our office to request authorization. Prescriptions will not be filled after 5 pm or on week-ends. 4. Avoid getting constipated.  Between the surgery and the pain medications, it is common to experience some constipation.  Increasing fluid intake and taking a fiber supplement (such as Metamucil, Citrucel, FiberCon, MiraLax, etc) 1-2 times a day regularly will usually help prevent this problem from occurring.  A mild laxative (prune juice, Milk of Magnesia, MiraLax, etc) should be taken according to package directions if there are no bowel movements after 48 hours.   5. Wash / shower every day.  No soaking or swimming until the incision is healed.   6. Keep incision clean and dry.  You may leave the incision open  to air.  It is normal to have a small amount of pink/clear drainage from the incision while it is healing, you may wish to keep the area covered with gauze to protect your clothing.       7. ACTIVITIES as tolerated:   a. You may resume regular (light) daily activities beginning the next day--such as daily self-care, walking, climbing  stairs--gradually increasing activities as tolerated.  If you can walk 30 minutes without difficulty, it is safe to try more intense activity such as jogging, treadmill, bicycling, low-impact aerobics, swimming, etc. b. Save the most intensive and strenuous activity for last such as sit-ups, heavy lifting, contact sports, etc  Refrain from any heavy lifting or straining until you are off narcotics for pain control.   c. DO NOT PUSH THROUGH PAIN.  Let pain be your guide: If it hurts to do something, don't do it.  Pain is your body warning you to avoid that activity for another week until the pain goes down. d. You may drive when you are no longer taking prescription pain medication, you can comfortably wear a seatbelt, and you can safely maneuver your car and apply brakes. e. Dennis Bast may have sexual intercourse when it is comfortable.  8. FOLLOW UP in our office a. Please call [facility] at [phone] to set up an appointment to see your surgeon in the office for a follow-up appointment approximately 2-3 weeks after your surgery. b. Make sure that you call for this appointment the day you arrive home to insure a convenient appointment time. 9. IF YOU HAVE DISABILITY OR FAMILY LEAVE FORMS, BRING THEM TO THE OFFICE FOR PROCESSING.  DO NOT GIVE THEM TO YOUR DOCTOR.   WHEN TO CALL us [phone]: 1. Poor pain control 2. Reactions / problems with new medications (rash/itching, nausea, etc)  3. Fever over 101.5 F (38.5 C) 4. Worsening swelling or bruising 5. Continued bleeding from incision. 6. Increased pain, redness, or drainage from the incision 7. Difficulty breathing / swallowing   The clinic staff is available to answer your questions during regular business hours (8:30am-5pm).  Please dont hesitate to call and ask to speak to one of our nurses for clinical concerns.   If you have a medical emergency, go to the nearest emergency room or call 911.  A surgeon from [facility] is always  on call at the [facility], [place], [place], [place], [place]  [id number] ? MAIN: [phone] ? TOLL FREE: [phone] ?  FAX [phone] [email or web address]       Post Anesthesia Home Care Instructions  Activity: Get plenty of rest for the remainder of the day. A responsible individual must stay with you for 24 hours following the procedure.  For the next 24 hours, DO NOT: -Drive a car -Paediatric nurse -Drink alcoholic beverages -Take any medication unless instructed by your physician -Make any legal decisions or sign important papers.  Meals: Start with liquid foods such as gelatin or soup. Progress to regular foods as tolerated. Avoid greasy, spicy, heavy foods. If nausea and/or vomiting occur, drink only clear liquids until the nausea and/or vomiting subsides. Call your physician if vomiting continues.  Special Instructions/Symptoms: Your throat may feel dry or sore from the anesthesia or the breathing tube placed in your throat during surgery. If this causes discomfort, gargle with warm salt water. The discomfort should disappear within 24 hours.  If you had a scopolamine patch placed behind  your ear for the management of post- operative nausea and/or vomiting:  1. The medication in the patch is effective for 72 hours, after which it should be removed.  Wrap patch in a tissue and discard in the trash. Wash hands thoroughly with soap and water. 2. You may remove the patch earlier than 72 hours if you experience unpleasant side effects which may include dry mouth, dizziness or visual disturbances. 3. Avoid touching the patch. Wash your hands with soap and water after contact with the patch.

## 2018-03-12 ENCOUNTER — Encounter (HOSPITAL_BASED_OUTPATIENT_CLINIC_OR_DEPARTMENT_OTHER): Payer: Self-pay | Admitting: Surgery

## 2019-02-24 ENCOUNTER — Encounter: Payer: Self-pay | Admitting: Medical

## 2019-02-25 ENCOUNTER — Telehealth: Payer: Self-pay | Admitting: Medical

## 2019-02-25 DIAGNOSIS — R109 Unspecified abdominal pain: Secondary | ICD-10-CM

## 2019-02-25 NOTE — Telephone Encounter (Signed)
Referral to GI placed

## 2019-02-26 ENCOUNTER — Encounter: Payer: Self-pay | Admitting: Gastroenterology

## 2019-03-05 ENCOUNTER — Other Ambulatory Visit: Payer: Self-pay

## 2019-03-05 ENCOUNTER — Ambulatory Visit (INDEPENDENT_AMBULATORY_CARE_PROVIDER_SITE_OTHER): Payer: 59 | Admitting: Nurse Practitioner

## 2019-03-05 ENCOUNTER — Encounter: Payer: Self-pay | Admitting: Nurse Practitioner

## 2019-03-05 VITALS — BP 126/84 | HR 76 | Temp 97.7°F | Ht 66.0 in | Wt 144.0 lb

## 2019-03-05 DIAGNOSIS — R12 Heartburn: Secondary | ICD-10-CM

## 2019-03-05 DIAGNOSIS — K219 Gastro-esophageal reflux disease without esophagitis: Secondary | ICD-10-CM

## 2019-03-05 DIAGNOSIS — R140 Abdominal distension (gaseous): Secondary | ICD-10-CM | POA: Diagnosis not present

## 2019-03-05 DIAGNOSIS — A048 Other specified bacterial intestinal infections: Secondary | ICD-10-CM

## 2019-03-05 DIAGNOSIS — Z8371 Family history of colonic polyps: Secondary | ICD-10-CM

## 2019-03-05 DIAGNOSIS — E559 Vitamin D deficiency, unspecified: Secondary | ICD-10-CM

## 2019-03-05 DIAGNOSIS — Z8619 Personal history of other infectious and parasitic diseases: Secondary | ICD-10-CM

## 2019-03-05 NOTE — Patient Instructions (Addendum)
If you are age 46 or older, your body mass index should be between 23-30. Your Body mass index is 23.24 kg/m. If this is out of the aforementioned range listed, please consider follow up with your Primary Care Provider.  If you are age 46 or younger, your body mass index should be between 19-25. Your Body mass index is 23.24 kg/m. If this is out of the aformentioned range listed, please consider follow up with your Primary Care Provider.   You have been scheduled for an endoscopy and colonoscopy. Please follow the written instructions given to you at your visit today. Please pick up your prep supplies at the pharmacy within the next 1-3 days. If you use inhalers (even only as needed), please bring them with you on the day of your procedure.  Your provider has requested that you go to the basement level for lab work before leaving today. Press "B" on the elevator. The lab is located at the first door on the left as you exit the elevator.  You have been scheduled for an abdominal ultrasound at [facility] center on 03/10/2019 at 11:00am. Please arrive 15 minutes prior to your appointment for registration. Make certain not to have anything to eat or drink 6 hours prior to your appointment. Should you need to reschedule your appointment, please contact radiology at [phone]. This test typically takes about 30 minutes to perform.   Due to recent changes in healthcare laws, you may see the results of your imaging and laboratory studies on MyChart before your provider has had a chance to review them.  We understand that in some cases there may be results that are confusing or concerning to you. Not all laboratory results come back in the same time frame and the provider may be waiting for multiple results in order to interpret others.  Please give [place] 48 hours in order for your provider to thoroughly review all the results before contacting the office for clarification of your results.   Please  continue with Omeprazole Phillips bacteria probiotic once daily Avoid spicy foods.  Due to recent changes in healthcare laws, you may see the results of your imaging and laboratory studies on MyChart before your provider has had a chance to review them.  We understand that in some cases there may be results that are confusing or concerning to you. Not all laboratory results come back in the same time frame and the provider may be waiting for multiple results in order to interpret others.  Please give [place] 48 hours in order for your provider to thoroughly review all the results before contacting the office for clarification of your results.   Thank you for choosing [facility] Noralyn Pick, CRNP

## 2019-03-05 NOTE — Progress Notes (Signed)
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 Pylori 20 years ago treated while he was living in [place]. Past surgical history includes excision of a perianal cyst in 2020. He presents today as referred by Johnny Pai PA-C for further evaluation regarding heartburn and upper abdominal bloat. He typically has heartburn once monthly for which he takes OTC antiacid with relief. He has experiencing increased heartburn for the past few months which occurs several days weekly. No dysphagia. He developed upper abdominal bloat and distension for the past 2 weeks which occurs after eating, worse after eating spicy foods. He drinks milk, milk tea and yogurt on a regular basis. He has decreased his dairy intake. He drinks 2 to 3 cups of coffee or tea daily. He started taking Prilosec 20mg  once daily for the past 2 weeks and his heartburn has decreased. No NSAID use. He is passing a normal brown formed stool once daily. No rectal bleeding or black stools. He has intentionally lost 10 lbs over the past year. Father with history of colon polyps.  No family history of esophageal, gastric or colorectal cancer.   Past Medical History:  Diagnosis Date  . Anal fissure    25 years ago   . Hyperlipidemia   . Low vitamin D level    Past Surgical History:  Procedure Laterality Date  . CYST EXCISION N/A 03/11/2018   Procedure: EXCISION OF PERINEAL CYST;  Surgeon: Clovis Riley, MD;  Location: [facility];  Service: General;  Laterality: N/A;  . ESOPHAGOGASTRODUODENOSCOPY     over 25 years ago. In [place]  . NO PAST SURGERIES     Family History: Father age 24 with history of colon polyps. Mother age 34 with IBS. Two brothers, one with HTN.   Social History: Married. [employer]. Nonsmoker. He  drinks 1 or 2 alcohol beverages monthly.  No drug use.     Outpatient Encounter Medications as of 03/05/2019  Medication Sig  . Cholecalciferol (VITAMIN D) 50 MCG (2000 UT) tablet Take 2,000 Units by mouth daily.  Marland Kitchen omeprazole (PRILOSEC) 20 MG capsule Take 20 mg by mouth daily.  . [DISCONTINUED] acetaminophen (TYLENOL) 325 MG tablet Take 2 tablets (650 mg total) by mouth every 6 (six) hours as needed for mild pain or moderate pain.  . [DISCONTINUED] ibuprofen (MOTRIN IB) 200 MG tablet Take 1-2 tablets (200-400 mg total) by mouth every 6 (six) hours as needed.  . [DISCONTINUED] traMADol (ULTRAM) 50 MG tablet Take 1 tablet (50 mg total) by mouth every 6 (six) hours as needed for severe pain (for pain not relieved by tylenol/ibuprofen).   No facility-administered encounter medications on file as of 03/05/2019.     REVIEW OF SYSTEMS:  Gen: + intentional 10lb weight loss. Denies fever, sweats or chills.  CV: Denies chest pain, palpitations or edema. Resp: Denies cough, shortness of breath of hemoptysis.  GI: See HPI.   GU : Denies urinary burning, blood in urine, increased urinary frequency or incontinence. MS: Denies joint pain, muscles aches or weakness. Derm: Denies rash, itchiness, skin lesions or unhealing ulcers. Psych: Denies depression, anxiety, memory loss, suicidal ideation and confusion. Heme: Denies bruising, bleeding. Neuro:  Denies headaches, dizziness or paresthesias. Endo:  Denies any problems with DM, thyroid or adrenal function.  PHYSICAL EXAM: BP 126/84   Pulse 76   Temp 97.7 F (36.5 C)   Ht 5\' 6"  (1.676 m)   Wt 144 lb (65.3 kg)   BMI 23.24 kg/m  General: Well developed  44 year old male in no acute distress. Head: Normocephalic and atraumatic. Eyes:  Sclerae non-icteric, conjunctive pink. Ears: Normal auditory acuity. Mouth: Dentition intact. No ulcers or lesions.  Neck: Supple, no lymphadenopathy or thyromegaly.  Lungs: Clear bilaterally to auscultation  without wheezes, crackles or rhonchi. Heart: Regular rate and rhythm. No murmur, rub or gallop appreciated.  Abdomen: Soft, nontender, non distended. No masses. No hepatosplenomegaly. Normoactive bowel sounds x 4 quadrants.  Rectal: Deferred.  Musculoskeletal: Symmetrical with no gross deformities. Skin: Warm and dry. No rash or lesions on visible extremities. Extremities: No edema. Neurological: Alert oriented x 4, no focal deficits.  Psychological:  Alert and cooperative. Normal mood and affect.  ASSESSMENT AND PLAN:  110. 44 year old male with GERD symptoms and upper abdominal bloat. Remote history of H. Pylori.  -Continue Omeprazole 20mg  QD -Probiotic of choice once daily -Continue to reduce dairy products, avoid spicy foods -Abdominal sonogram -CBC, CMP, CRP, tTg, IGA -EGD benefits and risks discussed including risk with sedation, risk of bleeding, perforation and infection  -Patient to call our office if his symptoms worsen  2. Family history of colon polyps.  -Colonoscopy benefits and risks discussed including risk with sedation, risk of bleeding, perforation and infection   3. Vitamin D deficiency -Vitamin D level   Further follow up to be determined after the above evaluation completed             CC:  Saguier, Percell Miller, PA-C

## 2019-03-06 ENCOUNTER — Other Ambulatory Visit (HOSPITAL_BASED_OUTPATIENT_CLINIC_OR_DEPARTMENT_OTHER): Payer: 59

## 2019-03-06 NOTE — Progress Notes (Signed)
Agree with the plan RG 

## 2019-03-07 ENCOUNTER — Other Ambulatory Visit (INDEPENDENT_AMBULATORY_CARE_PROVIDER_SITE_OTHER): Payer: 59

## 2019-03-07 ENCOUNTER — Other Ambulatory Visit: Payer: Self-pay

## 2019-03-07 ENCOUNTER — Ambulatory Visit (AMBULATORY_SURGERY_CENTER): Payer: 59 | Admitting: Gastroenterology

## 2019-03-07 ENCOUNTER — Encounter: Payer: Self-pay | Admitting: Gastroenterology

## 2019-03-07 VITALS — BP 121/89 | HR 72 | Temp 95.9°F | Resp 16 | Ht 66.0 in | Wt 144.0 lb

## 2019-03-07 DIAGNOSIS — D125 Benign neoplasm of sigmoid colon: Secondary | ICD-10-CM

## 2019-03-07 DIAGNOSIS — A048 Other specified bacterial intestinal infections: Secondary | ICD-10-CM | POA: Diagnosis not present

## 2019-03-07 DIAGNOSIS — Z1211 Encounter for screening for malignant neoplasm of colon: Secondary | ICD-10-CM

## 2019-03-07 DIAGNOSIS — K2950 Unspecified chronic gastritis without bleeding: Secondary | ICD-10-CM

## 2019-03-07 DIAGNOSIS — R140 Abdominal distension (gaseous): Secondary | ICD-10-CM

## 2019-03-07 DIAGNOSIS — K219 Gastro-esophageal reflux disease without esophagitis: Secondary | ICD-10-CM

## 2019-03-07 DIAGNOSIS — D124 Benign neoplasm of descending colon: Secondary | ICD-10-CM

## 2019-03-07 DIAGNOSIS — E559 Vitamin D deficiency, unspecified: Secondary | ICD-10-CM

## 2019-03-07 DIAGNOSIS — Z8371 Family history of colonic polyps: Secondary | ICD-10-CM | POA: Diagnosis not present

## 2019-03-07 DIAGNOSIS — R109 Unspecified abdominal pain: Secondary | ICD-10-CM | POA: Diagnosis not present

## 2019-03-07 LAB — COMPREHENSIVE METABOLIC PANEL
ALT: 11 U/L (ref 0–53)
AST: 14 U/L (ref 0–37)
Albumin: 4.4 g/dL (ref 3.5–5.2)
Alkaline Phosphatase: 83 U/L (ref 39–117)
BUN: 14 mg/dL (ref 6–23)
CO2: 32 mEq/L (ref 19–32)
Calcium: 9.6 mg/dL (ref 8.4–10.5)
Chloride: 101 mEq/L (ref 96–112)
Creatinine, Ser: 0.95 mg/dL (ref 0.40–1.50)
GFR: 85.56 mL/min (ref >60.00)
Glucose, Bld: 83 mg/dL (ref 70–99)
Potassium: 3.8 mEq/L (ref 3.5–5.1)
Sodium: 140 mEq/L (ref 135–145)
Total Bilirubin: 1 mg/dL (ref 0.2–1.2)
Total Protein: 7.6 g/dL (ref 6.0–8.3)

## 2019-03-07 LAB — CBC WITH DIFFERENTIAL/PLATELET
Basophils Absolute: 0 10*3/uL (ref 0.0–0.1)
Basophils Relative: 0.4 % (ref 0.0–3.0)
Eosinophils Absolute: 0.1 10*3/uL (ref 0.0–0.7)
Eosinophils Relative: 1 % (ref 0.0–5.0)
HCT: 45.1 % (ref 39.0–52.0)
Hemoglobin: 15.4 g/dL (ref 13.0–17.0)
Lymphocytes Relative: 24.5 % (ref 12.0–46.0)
Lymphs Abs: 1.6 10*3/uL (ref 0.7–4.0)
MCHC: 34.1 g/dL (ref 30.0–36.0)
MCV: 86.4 fl (ref 78.0–100.0)
Monocytes Absolute: 0.5 10*3/uL (ref 0.1–1.0)
Monocytes Relative: 7.5 % (ref 3.0–12.0)
Neutro Abs: 4.2 10*3/uL (ref 1.4–7.7)
Neutrophils Relative %: 66.6 % (ref 43.0–77.0)
Platelets: 271 10*3/uL (ref 150.0–400.0)
RBC: 5.22 Mil/uL (ref 4.22–5.81)
RDW: 13 % (ref 11.5–15.5)
WBC: 6.4 10*3/uL (ref 4.0–10.5)

## 2019-03-07 LAB — IGA: IgA: 88 mg/dL (ref 68–378)

## 2019-03-07 LAB — VITAMIN D 25 HYDROXY (VIT D DEFICIENCY, FRACTURES): VITD: 36.66 ng/mL (ref 30.00–100.00)

## 2019-03-07 LAB — C-REACTIVE PROTEIN: CRP: <1 mg/dL (ref 0.5–20.0)

## 2019-03-07 MED ORDER — SODIUM CHLORIDE 0.9 % IV SOLN: 500.0000 mL | Freq: Once | INTRAVENOUS | Status: DC

## 2019-03-07 MED ORDER — OMEPRAZOLE 20 MG PO CPDR: 20.0000 mg | DELAYED_RELEASE_CAPSULE | Freq: Every day | ORAL | 4 refills | Status: DC

## 2019-03-07 MED FILL — OMEPRAZOLE DR 20 MG CAPSULE: 20 | 90 days supply | Qty: 90 | Fill #0

## 2019-03-07 NOTE — Op Note (Signed)
[facility] Patient Name: Johnny Riggs Procedure Date: 03/07/2019 1:23 PM MRN: [id number] Endoscopist: Jackquline Denmark , MD Age: 44 Referring MD:  Date of Birth: 1975/12/30 Gender: Male Account #: [id number] Procedure:                Upper GI endoscopy Indications:              Heartburn. H/O HP in past. Medicines:                Monitored Anesthesia Care Procedure:                Pre-Anesthesia Assessment:                           - Prior to the procedure, a History and Physical                            was performed, and patient medications and                            allergies were reviewed. The patient's tolerance of                            previous anesthesia was also reviewed. The risks                            and benefits of the procedure and the sedation                            options and risks were discussed with the patient.                            All questions were answered, and informed consent                            was obtained. Prior Anticoagulants: The patient has                            taken no previous anticoagulant or antiplatelet                            agents. ASA Grade Assessment: I - A normal, healthy                            patient. After reviewing the risks and benefits,                            the patient was deemed in satisfactory condition to                            undergo the procedure.                           After obtaining informed consent, the endoscope was  passed under direct vision. Throughout the                            procedure, the patient's blood pressure, pulse, and                            oxygen saturations were monitored continuously. The                            Endoscope was introduced through the mouth, and                            advanced to the second part of duodenum. The upper                            GI endoscopy was accomplished without  difficulty.                            The patient tolerated the procedure well. Scope In: Scope Out: Findings:                 The examined esophagus was normal.                           Localized mild inflammation characterized by                            erythema was found in the gastric antrum. Biopsies                            were taken with a cold forceps for histology.                           There was a small yellowish submucosal lesion with                            positive pillow sign (highly s/o lipoma) 10 mm in                            diameter, at the incisura. Biopsies were taken with                            a cold forceps for histology using tunnel technique.                           The examined duodenum was normal. Biopsies for                            histology were taken with a cold forceps for                            evaluation of celiac disease. Complications:            No immediate complications. Estimated Blood Loss:     Estimated blood  loss: none. Impression:               -Mild gastritis                           -Incidental submucosal lesion (likely lipoma).                            Biopsied. Recommendation:           - Patient has a contact number available for                            emergencies. The signs and symptoms of potential                            delayed complications were discussed with the                            patient. Return to normal activities tomorrow.                            Written discharge instructions were provided to the                            patient.                           - Resume previous diet.                           - Continue omeprazole 20 mg p.o. once a day.                           - Await pathology results.                           - Return to GI clinic in 12 weeks.                           - The findings and recommendations were discussed                            with the  patient's wife. Jackquline Denmark, MD 03/07/2019 2:08:05 PM This report has been signed electronically.

## 2019-03-07 NOTE — Op Note (Signed)
[facility] Patient Name: Johnny Riggs Procedure Date: 03/07/2019 1:22 PM MRN: [id number] Endoscopist: Jackquline Denmark , MD Age: 44 Referring MD:  Date of Birth: [birth year] Gender: Male Account #: [id number] Procedure:                Colonoscopy Indications:              Colon cancer screening in patient at increased                            risk: Family history of 1st-degree relative with                            colon polyps (dad) Medicines:                Monitored Anesthesia Care Procedure:                Pre-Anesthesia Assessment:                           - Prior to the procedure, a History and Physical                            was performed, and patient medications and                            allergies were reviewed. The patient's tolerance of                            previous anesthesia was also reviewed. The risks                            and benefits of the procedure and the sedation                            options and risks were discussed with the patient.                            All questions were answered, and informed consent                            was obtained. Prior Anticoagulants: The patient has                            taken no previous anticoagulant or antiplatelet                            agents. ASA Grade Assessment: I - A normal, healthy                            patient. After reviewing the risks and benefits,                            the patient was deemed in satisfactory condition to  undergo the procedure.                           After obtaining informed consent, the colonoscope                            was passed under direct vision. Throughout the                            procedure, the patient's blood pressure, pulse, and                            oxygen saturations were monitored continuously. The                            Colonoscope was introduced through the anus and                 advanced to the 2 cm into the ileum. The                            colonoscopy was performed without difficulty. The                            patient tolerated the procedure well. The quality                            of the bowel preparation was good. The terminal                            ileum, ileocecal valve, appendiceal orifice, and                            rectum were photographed. Scope In: 1:43:06 PM Scope Out: 1:57:23 PM Scope Withdrawal Time: 0 hours 11 minutes 54 seconds  Total Procedure Duration: 0 hours 14 minutes 17 seconds  Findings:                 Two sessile polyps were found in the mid sigmoid                            colon and mid descending colon. The polyps were 4                            to 6 mm in size. These polyps were removed with a                            cold snare. Resection and retrieval were complete.                            Estimated blood loss: none.                           Non-bleeding internal hemorrhoids were found during  retroflexion. The hemorrhoids were small.                           The terminal ileum appeared normal.                           The exam was otherwise without abnormality on                            direct and retroflexion views. Complications:            No immediate complications. Estimated Blood Loss:     Estimated blood loss: none. Impression:               - Two 4 to 6 mm polyps in the mid sigmoid colon and                            in the mid descending colon, removed with a cold                            snare. Resected and retrieved.                           - Minimal Non-bleeding internal hemorrhoids.                           - Otherwise normal colonoscopy to TI. Recommendation:           - Patient has a contact number available for                            emergencies. The signs and symptoms of potential                            delayed complications  were discussed with the                            patient. Return to normal activities tomorrow.                            Written discharge instructions were provided to the                            patient.                           - Resume previous diet.                           - Continue present medications.                           - Await pathology results.                           - Repeat colonoscopy for surveillance based on  pathology results.                           - The findings and recommendations were discussed                            with the patient's family. Jackquline Denmark, MD 03/07/2019 2:14:02 PM This report has been signed electronically.

## 2019-03-07 NOTE — Progress Notes (Signed)
Temp by JB  Vitals by CW 

## 2019-03-07 NOTE — Patient Instructions (Signed)
Handouts on hemorrhoids, polyps, and gastritis given to you today  Await pathology results     YOU HAD AN ENDOSCOPIC PROCEDURE TODAY AT [facility]:   Refer to the procedure report that was given to you for any specific questions about what was found during the examination.  If the procedure report does not answer your questions, please call your gastroenterologist to clarify.  If you requested that your care partner not be given the details of your procedure findings, then the procedure report has been included in a sealed envelope for you to review at your convenience later.  YOU SHOULD EXPECT: Some feelings of bloating in the abdomen. Passage of more gas than usual.  Walking can help get rid of the air that was put into your GI tract during the procedure and reduce the bloating. If you had a lower endoscopy (such as a colonoscopy or flexible sigmoidoscopy) you may notice spotting of blood in your stool or on the toilet paper. If you underwent a bowel prep for your procedure, you may not have a normal bowel movement for a few days.  Please Note:  You might notice some irritation and congestion in your nose or some drainage.  This is from the oxygen used during your procedure.  There is no need for concern and it should clear up in a day or so.  SYMPTOMS TO REPORT IMMEDIATELY:   Following lower endoscopy (colonoscopy or flexible sigmoidoscopy):  Excessive amounts of blood in the stool  Significant tenderness or worsening of abdominal pains  Swelling of the abdomen that is new, acute  Fever of 100F or higher   Following upper endoscopy (EGD)  Vomiting of blood or coffee ground material  New chest pain or pain under the shoulder blades  Painful or persistently difficult swallowing  New shortness of breath  Fever of 100F or higher  Black, tarry-looking stools  For urgent or emergent issues, a gastroenterologist can be reached at any hour by calling [phone]  [phone].   DIET:  We do recommend a small meal at first, but then you may proceed to your regular diet.  Drink plenty of fluids but you should avoid alcoholic beverages for 24 hours.  ACTIVITY:  You should plan to take it easy for the rest of today and you should NOT DRIVE or use heavy machinery until tomorrow (because of the sedation medicines used during the test).    FOLLOW UP: Our staff will call the number listed on your records 48-72 hours following your procedure to check on you and address any questions or concerns that you may have regarding the information given to you following your procedure. If we do not reach you, we will leave a message.  We will attempt to reach you two times.  During this call, we will ask if you have developed any symptoms of COVID 19. If you develop any symptoms (ie: fever, flu-like symptoms, shortness of breath, cough etc.) before then, please call [phone].  If you test positive for Covid 19 in the 2 weeks post procedure, please call and report this information to [place].    If any biopsies were taken you will be contacted by phone or by letter within the next 1-3 weeks.  Please call us at [phone] if you have not heard about the biopsies in 3 weeks.    SIGNATURES/CONFIDENTIALITY: You and/or your care partner have signed paperwork which will be entered into your electronic medical record.  These signatures attest  to the fact that that the information above on your After Visit Summary has been reviewed and is understood.  Full responsibility of the confidentiality of this discharge information lies with you and/or your care-partner. 

## 2019-03-07 NOTE — Progress Notes (Signed)
To PACU, VSS. Report to Rn.tb 

## 2019-03-07 NOTE — Progress Notes (Signed)
Called to room to assist during endoscopic procedure.  Patient ID and intended procedure confirmed with present staff. Received instructions for my participation in the procedure from the performing physician.  

## 2019-03-10 ENCOUNTER — Ambulatory Visit (HOSPITAL_BASED_OUTPATIENT_CLINIC_OR_DEPARTMENT_OTHER)
Admission: RE | Admit: 2019-03-10 | Discharge: 2019-03-10 | Disposition: A | Discharge status: Home / Self Care | Payer: 59 | Source: Ambulatory Visit | Attending: Nurse Practitioner | Admitting: Nurse Practitioner

## 2019-03-10 ENCOUNTER — Other Ambulatory Visit: Payer: Self-pay

## 2019-03-10 ENCOUNTER — Telehealth: Payer: Self-pay

## 2019-03-10 DIAGNOSIS — Z8371 Family history of colonic polyps: Secondary | ICD-10-CM | POA: Diagnosis not present

## 2019-03-10 DIAGNOSIS — A048 Other specified bacterial intestinal infections: Secondary | ICD-10-CM | POA: Diagnosis not present

## 2019-03-10 DIAGNOSIS — R140 Abdominal distension (gaseous): Secondary | ICD-10-CM | POA: Diagnosis not present

## 2019-03-10 DIAGNOSIS — K219 Gastro-esophageal reflux disease without esophagitis: Secondary | ICD-10-CM | POA: Diagnosis not present

## 2019-03-10 DIAGNOSIS — K8020 Calculus of gallbladder without cholecystitis without obstruction: Secondary | ICD-10-CM | POA: Diagnosis not present

## 2019-03-10 LAB — TISSUE TRANSGLUTAMINASE, IGA: (tTG) Ab, IgA: 1 U/mL

## 2019-03-10 NOTE — Telephone Encounter (Signed)
abd [place] results are available for review-please advise

## 2019-03-11 ENCOUNTER — Telehealth: Payer: Self-pay

## 2019-03-11 ENCOUNTER — Telehealth: Payer: Self-pay | Admitting: Nurse Practitioner

## 2019-03-11 ENCOUNTER — Ambulatory Visit (HOSPITAL_COMMUNITY)
Admission: RE | Admit: 2019-03-11 | Discharge: 2019-03-11 | Disposition: A | Discharge status: Home / Self Care | Payer: 59 | Source: Ambulatory Visit | Attending: Nurse Practitioner | Admitting: Nurse Practitioner

## 2019-03-11 DIAGNOSIS — K828 Other specified diseases of gallbladder: Secondary | ICD-10-CM | POA: Diagnosis not present

## 2019-03-11 DIAGNOSIS — R1900 Intra-abdominal and pelvic swelling, mass and lump, unspecified site: Secondary | ICD-10-CM

## 2019-03-11 DIAGNOSIS — K8020 Calculus of gallbladder without cholecystitis without obstruction: Secondary | ICD-10-CM | POA: Diagnosis not present

## 2019-03-11 MED ORDER — GADOBUTROL 1 MMOL/ML IV SOLN
6.5000 mL | Freq: Once | INTRAVENOUS | Status: AC | PRN
  Administered 2019-03-11: 6.5 mL via INTRAVENOUS

## 2019-03-11 NOTE — Telephone Encounter (Signed)
Note, MRI done but results pending at this time.

## 2019-03-11 NOTE — Telephone Encounter (Signed)
Bre, please order a stat abdominal MRI with and without contrast today. DX: gallbladder mass seen on abd sono.

## 2019-03-11 NOTE — Telephone Encounter (Signed)
Called and spoke with patient-patient is agreeable to plan of care and has requested th MRI be scheduled at [facility]; order placed in Epic; awaiting pre cert approval as radiology schedulers cannot schedule appt until pre cert completed; will notify patient of appt date/time when MRI has been scheduled; patient advised to be NPO as of now and informed he would receive a call once MRI appt made; Patient advised to call back to the office at [phone] should questions/concerns arise;  Patient verbalized understanding of information/instructions;

## 2019-03-11 NOTE — Telephone Encounter (Signed)
Bre, thank you for the update, keep me posted.

## 2019-03-11 NOTE — Telephone Encounter (Signed)
  Follow up Call-  Call back number 03/07/2019  Post procedure Call Back phone  # [phone]  Permission to leave phone message Yes     Patient questions:  Do you have a fever, pain , or abdominal swelling? No. Pain Score  0 *  Have you tolerated food without any problems? Yes.    Have you been able to return to your normal activities? Yes.    Do you have any questions about your discharge instructions: Diet   No. Medications  No. Follow up visit  No.  Do you have questions or concerns about your Care? No.  Actions: * If pain score is 4 or above: No action needed, pain <4. 1. Have you developed a fever since your procedure? no  2.   Have you had an respiratory symptoms (SOB or cough) since your procedure? no  3.   Have you tested positive for COVID 19 since your procedure no  4.   Have you had any family members/close contacts diagnosed with the COVID 19 since your procedure?  no   If yes to any of these questions please route to Joylene John, RN and Alphonsa Gin, Therapist, sports.

## 2019-03-11 NOTE — Telephone Encounter (Signed)
Called and spoke with patient-patient informed he has been scheduled for his MRI abd at 2:30 pm at [facility] as [facility] did not have avilablility, he was also informed he needs to be NPO starting now; Patient verbalized understanding of information/instructions;   Patient advised to call back to the office at [phone] should questions/concerns arise;

## 2019-03-12 ENCOUNTER — Encounter: Payer: Self-pay | Admitting: Gastroenterology

## 2019-03-21 ENCOUNTER — Other Ambulatory Visit (HOSPITAL_COMMUNITY)
Admission: RE | Admit: 2019-03-21 | Discharge: 2019-03-21 | Disposition: A | Discharge status: Home / Self Care | Payer: 59 | Source: Ambulatory Visit | Attending: General Surgery | Admitting: General Surgery

## 2019-03-21 ENCOUNTER — Other Ambulatory Visit: Payer: Self-pay

## 2019-03-21 ENCOUNTER — Encounter (HOSPITAL_BASED_OUTPATIENT_CLINIC_OR_DEPARTMENT_OTHER): Payer: Self-pay | Admitting: General Surgery

## 2019-03-21 DIAGNOSIS — Z20822 Contact with and (suspected) exposure to covid-19: Secondary | ICD-10-CM | POA: Insufficient documentation

## 2019-03-21 DIAGNOSIS — Z01812 Encounter for preprocedural laboratory examination: Secondary | ICD-10-CM | POA: Diagnosis not present

## 2019-03-21 LAB — SARS CORONAVIRUS 2 (TAT 6-24 HRS): SARS Coronavirus 2: NEGATIVE

## 2019-03-24 ENCOUNTER — Other Ambulatory Visit: Payer: Self-pay | Admitting: General Surgery

## 2019-03-24 ENCOUNTER — Ambulatory Visit: Payer: 59 | Admitting: Gastroenterology

## 2019-03-25 ENCOUNTER — Ambulatory Visit (HOSPITAL_BASED_OUTPATIENT_CLINIC_OR_DEPARTMENT_OTHER): Payer: 59 | Admitting: Anesthesiology

## 2019-03-25 ENCOUNTER — Encounter (HOSPITAL_BASED_OUTPATIENT_CLINIC_OR_DEPARTMENT_OTHER): Payer: Self-pay | Admitting: General Surgery

## 2019-03-25 ENCOUNTER — Encounter (HOSPITAL_BASED_OUTPATIENT_CLINIC_OR_DEPARTMENT_OTHER)
Admission: RE | Disposition: A | Discharge status: Home / Self Care | Payer: Self-pay | Source: Home / Self Care | Attending: General Surgery

## 2019-03-25 ENCOUNTER — Ambulatory Visit (HOSPITAL_BASED_OUTPATIENT_CLINIC_OR_DEPARTMENT_OTHER)
Admission: RE | Admit: 2019-03-25 | Discharge: 2019-03-25 | Disposition: A | Discharge status: Home / Self Care | Payer: 59 | Attending: General Surgery | Admitting: General Surgery

## 2019-03-25 DIAGNOSIS — K8020 Calculus of gallbladder without cholecystitis without obstruction: Secondary | ICD-10-CM | POA: Insufficient documentation

## 2019-03-25 DIAGNOSIS — K8010 Calculus of gallbladder with chronic cholecystitis without obstruction: Secondary | ICD-10-CM | POA: Diagnosis not present

## 2019-03-25 DIAGNOSIS — G8918 Other acute postprocedural pain: Secondary | ICD-10-CM | POA: Diagnosis not present

## 2019-03-25 HISTORY — PX: CHOLECYSTECTOMY: SHX55

## 2019-03-25 HISTORY — DX: Gastro-esophageal reflux disease without esophagitis: K21.9

## 2019-03-25 SURGERY — LAPAROSCOPIC CHOLECYSTECTOMY
Anesthesia: General | Site: Abdomen

## 2019-03-25 MED ORDER — BUPIVACAINE HCL (PF) 0.25 % IJ SOLN
INTRAMUSCULAR | Status: AC
  Filled 2019-03-25: qty 30

## 2019-03-25 MED ORDER — OXYCODONE HCL 5 MG/5ML PO SOLN: 5.0000 mg | Freq: Once | ORAL | Status: AC | PRN

## 2019-03-25 MED ORDER — FENTANYL CITRATE (PF) 100 MCG/2ML IJ SOLN
INTRAMUSCULAR | Status: AC
  Filled 2019-03-25: qty 2

## 2019-03-25 MED ORDER — LIDOCAINE HCL (CARDIAC) PF 100 MG/5ML IV SOSY
PREFILLED_SYRINGE | INTRAVENOUS | Status: DC | PRN
  Administered 2019-03-25: 40 mg via INTRAVENOUS

## 2019-03-25 MED ORDER — ACETAMINOPHEN 500 MG PO TABS
1000.0000 mg | ORAL_TABLET | ORAL | Status: AC
  Administered 2019-03-25: 1000 mg via ORAL

## 2019-03-25 MED ORDER — ONDANSETRON HCL 4 MG/2ML IJ SOLN
INTRAMUSCULAR | Status: DC | PRN
  Administered 2019-03-25: 4 mg via INTRAVENOUS

## 2019-03-25 MED ORDER — OXYCODONE HCL 5 MG PO TABS
5.0000 mg | ORAL_TABLET | Freq: Once | ORAL | Status: AC | PRN
  Administered 2019-03-25: 5 mg via ORAL

## 2019-03-25 MED ORDER — BUPIVACAINE HCL 0.25 % IJ SOLN
INTRAMUSCULAR | Status: DC | PRN
  Administered 2019-03-25: 10 mL

## 2019-03-25 MED ORDER — OXYCODONE HCL 5 MG PO TABS: 5.0000 mg | ORAL_TABLET | Freq: Four times a day (QID) | ORAL | 0 refills | Status: DC | PRN

## 2019-03-25 MED ORDER — DEXAMETHASONE SODIUM PHOSPHATE 4 MG/ML IJ SOLN
INTRAMUSCULAR | Status: DC | PRN
  Administered 2019-03-25: 5 mg via INTRAVENOUS

## 2019-03-25 MED ORDER — ENSURE PRE-SURGERY PO LIQD: 296.0000 mL | Freq: Once | ORAL | Status: DC

## 2019-03-25 MED ORDER — FENTANYL CITRATE (PF) 100 MCG/2ML IJ SOLN
50.0000 ug | INTRAMUSCULAR | Status: AC | PRN
  Administered 2019-03-25: 100 ug via INTRAVENOUS
  Administered 2019-03-25 (×2): 50 ug via INTRAVENOUS

## 2019-03-25 MED ORDER — DEXMEDETOMIDINE HCL 200 MCG/2ML IV SOLN
INTRAVENOUS | Status: DC | PRN
  Administered 2019-03-25: 8 ug via INTRAVENOUS
  Administered 2019-03-25: 12 ug via INTRAVENOUS

## 2019-03-25 MED ORDER — MIDAZOLAM HCL 2 MG/2ML IJ SOLN
1.0000 mg | INTRAMUSCULAR | Status: DC | PRN
  Administered 2019-03-25: 2 mg via INTRAVENOUS

## 2019-03-25 MED ORDER — MIDAZOLAM HCL 2 MG/2ML IJ SOLN
INTRAMUSCULAR | Status: AC
  Filled 2019-03-25: qty 2

## 2019-03-25 MED ORDER — FENTANYL CITRATE (PF) 100 MCG/2ML IJ SOLN
25.0000 ug | INTRAMUSCULAR | Status: DC | PRN
  Administered 2019-03-25: 50 ug via INTRAVENOUS

## 2019-03-25 MED ORDER — CEFAZOLIN SODIUM-DEXTROSE 2-4 GM/100ML-% IV SOLN
2.0000 g | INTRAVENOUS | Status: AC
  Administered 2019-03-25: 2 g via INTRAVENOUS

## 2019-03-25 MED ORDER — PROPOFOL 10 MG/ML IV BOLUS
INTRAVENOUS | Status: DC | PRN
  Administered 2019-03-25: 150 mg via INTRAVENOUS

## 2019-03-25 MED ORDER — DEXAMETHASONE SODIUM PHOSPHATE 10 MG/ML IJ SOLN
INTRAMUSCULAR | Status: AC
  Filled 2019-03-25: qty 1

## 2019-03-25 MED ORDER — GABAPENTIN 100 MG PO CAPS
ORAL_CAPSULE | ORAL | Status: AC
  Filled 2019-03-25: qty 1

## 2019-03-25 MED ORDER — EPHEDRINE SULFATE 50 MG/ML IJ SOLN
INTRAMUSCULAR | Status: DC | PRN
  Administered 2019-03-25: 10 mg via INTRAVENOUS

## 2019-03-25 MED ORDER — ACETAMINOPHEN 500 MG PO TABS
ORAL_TABLET | ORAL | Status: AC
  Filled 2019-03-25: qty 2

## 2019-03-25 MED ORDER — CEFAZOLIN SODIUM-DEXTROSE 2-4 GM/100ML-% IV SOLN
INTRAVENOUS | Status: AC
  Filled 2019-03-25: qty 100

## 2019-03-25 MED ORDER — OXYCODONE HCL 5 MG PO TABS
ORAL_TABLET | ORAL | Status: AC
  Filled 2019-03-25: qty 1

## 2019-03-25 MED ORDER — PROPOFOL 10 MG/ML IV BOLUS
INTRAVENOUS | Status: AC
  Filled 2019-03-25: qty 20

## 2019-03-25 MED ORDER — SODIUM CHLORIDE 0.9 % IR SOLN
Status: DC | PRN
  Administered 2019-03-25: 1000 mL

## 2019-03-25 MED ORDER — KETOROLAC TROMETHAMINE 15 MG/ML IJ SOLN
INTRAMUSCULAR | Status: AC
  Filled 2019-03-25: qty 1

## 2019-03-25 MED ORDER — LIDOCAINE-EPINEPHRINE 1 %-1:100000 IJ SOLN
INTRAMUSCULAR | Status: AC
  Filled 2019-03-25: qty 1

## 2019-03-25 MED ORDER — GABAPENTIN 100 MG PO CAPS
100.0000 mg | ORAL_CAPSULE | ORAL | Status: AC
  Administered 2019-03-25: 100 mg via ORAL

## 2019-03-25 MED ORDER — ONDANSETRON HCL 4 MG/2ML IJ SOLN
INTRAMUSCULAR | Status: AC
  Filled 2019-03-25: qty 2

## 2019-03-25 MED ORDER — KETOROLAC TROMETHAMINE 15 MG/ML IJ SOLN
15.0000 mg | INTRAMUSCULAR | Status: AC
  Administered 2019-03-25: 15 mg via INTRAVENOUS

## 2019-03-25 MED ORDER — LACTATED RINGERS IV SOLN: INTRAVENOUS | Status: DC

## 2019-03-25 MED ORDER — ONDANSETRON HCL 4 MG/2ML IJ SOLN: 4.0000 mg | Freq: Once | INTRAMUSCULAR | Status: DC | PRN

## 2019-03-25 MED ORDER — SUGAMMADEX SODIUM 200 MG/2ML IV SOLN
INTRAVENOUS | Status: DC | PRN
  Administered 2019-03-25: 200 mg via INTRAVENOUS

## 2019-03-25 MED ORDER — ROCURONIUM BROMIDE 100 MG/10ML IV SOLN
INTRAVENOUS | Status: DC | PRN
  Administered 2019-03-25: 50 mg via INTRAVENOUS

## 2019-03-25 MED ORDER — LIDOCAINE 2% (20 MG/ML) 5 ML SYRINGE
INTRAMUSCULAR | Status: AC
  Filled 2019-03-25: qty 5

## 2019-03-25 MED FILL — oxyCODONE HCL 5 MG TABS: 5 | 3 days supply | Qty: 10 | Fill #0

## 2019-03-25 SURGICAL SUPPLY — 43 items
APPLIER CLIP 5 13 M/L LIGAMAX5 (MISCELLANEOUS) ×3
BLADE CLIPPER SURG (BLADE) IMPLANT
CANISTER SUCT 1200ML W/VALVE (MISCELLANEOUS) ×3 IMPLANT
CHLORAPREP W/TINT 26 (MISCELLANEOUS) ×3 IMPLANT
CLIP APPLIE 5 13 M/L LIGAMAX5 (MISCELLANEOUS) ×1 IMPLANT
CLOSURE WOUND 1/2 X4 (GAUZE/BANDAGES/DRESSINGS) ×1
COVER MAYO STAND STRL (DRAPES) IMPLANT
COVER WAND RF STERILE (DRAPES) IMPLANT
DECANTER SPIKE VIAL GLASS SM (MISCELLANEOUS) ×2 IMPLANT
DERMABOND ADVANCED (GAUZE/BANDAGES/DRESSINGS) ×2
DERMABOND ADVANCED .7 DNX12 (GAUZE/BANDAGES/DRESSINGS) ×1 IMPLANT
DEVICE TROCAR PUNCTURE CLOSURE (ENDOMECHANICALS) IMPLANT
DRAPE C-ARM 42X72 X-RAY (DRAPES) IMPLANT
DRAPE LAPAROSCOPIC ABDOMINAL (DRAPES) ×3 IMPLANT
ELECT REM PT RETURN 9FT ADLT (ELECTROSURGICAL) ×3
ELECTRODE REM PT RTRN 9FT ADLT (ELECTROSURGICAL) ×1 IMPLANT
GLOVE BIO SURGEON STRL SZ7 (GLOVE) ×3 IMPLANT
GLOVE BIOGEL PI IND STRL 7.5 (GLOVE) ×1 IMPLANT
GLOVE BIOGEL PI INDICATOR 7.5 (GLOVE) ×2
GOWN STRL REUS W/ TWL LRG LVL3 (GOWN DISPOSABLE) ×3 IMPLANT
GOWN STRL REUS W/TWL LRG LVL3 (GOWN DISPOSABLE) ×6
GRASPER SUT TROCAR 14GX15 (MISCELLANEOUS) ×2 IMPLANT
HEMOSTAT SNOW SURGICEL 2X4 (HEMOSTASIS) ×2 IMPLANT
NS IRRIG 1000ML POUR BTL (IV SOLUTION) ×3 IMPLANT
PACK BASIN DAY SURGERY FS (CUSTOM PROCEDURE TRAY) ×3 IMPLANT
POUCH RETRIEVAL ECOSAC 10 (ENDOMECHANICALS) ×1 IMPLANT
POUCH RETRIEVAL ECOSAC 10MM (ENDOMECHANICALS) ×2
SCISSORS LAP 5X35 DISP (ENDOMECHANICALS) ×3 IMPLANT
SET CHOLANGIOGRAPH 5 50 .035 (SET/KITS/TRAYS/PACK) IMPLANT
SET IRRIG TUBING LAPAROSCOPIC (IRRIGATION / IRRIGATOR) ×3 IMPLANT
SET TUBE SMOKE EVAC HIGH FLOW (TUBING) ×3 IMPLANT
SLEEVE ENDOPATH XCEL 5M (ENDOMECHANICALS) ×6 IMPLANT
SLEEVE SCD COMPRESS KNEE MED (MISCELLANEOUS) ×3 IMPLANT
SPECIMEN JAR SMALL (MISCELLANEOUS) ×3 IMPLANT
STRIP CLOSURE SKIN 1/2X4 (GAUZE/BANDAGES/DRESSINGS) ×2 IMPLANT
SUT MNCRL AB 4-0 PS2 18 (SUTURE) ×3 IMPLANT
SUT VICRYL 0 UR6 27IN ABS (SUTURE) ×2 IMPLANT
TOWEL GREEN STERILE FF (TOWEL DISPOSABLE) ×6 IMPLANT
TRAY LAPAROSCOPIC (CUSTOM PROCEDURE TRAY) ×3 IMPLANT
TROCAR XCEL BLUNT TIP 100MML (ENDOMECHANICALS) ×3 IMPLANT
TROCAR XCEL NON-BLD 5MMX100MML (ENDOMECHANICALS) ×3 IMPLANT
TUBE CONNECTING 20'X1/4 (TUBING) ×1
TUBE CONNECTING 20X1/4 (TUBING) ×2 IMPLANT

## 2019-03-25 NOTE — H&P (Signed)
   28 yof referred by Dr Lyndel Safe for gallstones. 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 he is otherwise healthy. he has had some central abd pain after eating that goes away on own for some time that over the past six months has become more frequent. this occurs after eating especially fatty foods and then passes on own. has been so bad he almost went to er a couple times. no n/v. ppis not really helping. nl bms. has egd and colo fairly unremarkable. he has [place] that shows 2 cm gallstone and question of a mass. cbd nl. this was followed by an mr of abdomen that shows cholelithiasis with likely sludge - cannot rule out polyp for sure. he is here to discuss options   Past Surgical History Rolm Bookbinder, MD; 03/20/2019 4:53 PM) No pertinent past surgical history   Allergies Sabino Gasser, CMA; 03/20/2019 4:27 PM) No Known Drug Allergies [03/08/2018]: Allergies Reconciled   Medication History Sabino Gasser, CMA; 03/20/2019 4:27 PM) Vitamin D (Oral) Specific strength unknown - Active. Medications Reconciled  Social History Rolm Bookbinder, MD; 03/20/2019 4:53 PM) Alcohol use  Occasional alcohol use. Caffeine use  Tea. No drug use  Tobacco use  Never smoker.  Family History Rolm Bookbinder, MD; 03/20/2019 4:53 PM) Diabetes Mellitus  Mother. Hypertension  Father, Mother.  Vitals Sabino Gasser CMA; 03/20/2019 4:27 PM) 03/20/2019 4:27 PM Weight: 143.6 lb Height: 66in Body Surface Area: 1.74 m Body Mass Index: 23.18 kg/m  Temp.: 16F(Tympanic)  Pulse: 94 (Regular)  BP: 124/80 (Sitting, Left Arm, Standard)   Physical Exam Rolm Bookbinder MD; 03/20/2019 4:49 PM) General Mental Status-Alert. Orientation-Oriented X3.  Abdomen Note: soft no murphys sign minimal epigastric tenderness   Assessment & Plan Rolm Bookbinder MD; 03/20/2019 4:50 PM) CHOLELITHIASES (K80.20) Story: Laparoscopic cholecystectomy I discussed the procedure in  detail. I do think he has symptomatic gallstones. We discussed the risks and benefits of a laparoscopic cholecystectomy including, but not limited to bleeding, infection, injury to surrounding structures such as the intestine or liver, bile leak, retained gallstones, need to convert to an open procedure, prolonged diarrhea, blood clots such as DVT, common bile duct injury, anesthesia risks, and possible need for additional procedures. The likelihood of improvement in symptoms and return to the patient's normal status is good. We discussed the typical post-operative recovery course. will schedule next week.

## 2019-03-25 NOTE — Discharge Instructions (Signed)
[facility] LAPAROSCOPIC SURGERY: POST OP INSTRUCTIONS  Always review your discharge instruction sheet given to you by the facility where your surgery was performed. IF YOU HAVE DISABILITY OR FAMILY LEAVE FORMS, YOU MUST BRING THEM TO THE OFFICE FOR PROCESSING.   DO NOT GIVE THEM TO YOUR DOCTOR.  1. A prescription for pain medication may be given to you upon discharge.  Take your pain medication as prescribed, if needed.  If narcotic pain medicine is not needed, then you may take acetaminophen (Tylenol), naprosyn (Alleve), or ibuprofen (Advil) as needed.  Next dose of tylenol may be given at 4pm if needed, and Ibuprofen may be given at 6:30pm if needed.   2. Take your usually prescribed medications unless otherwise directed. 3. If you need a refill on your pain medication, please contact your pharmacy.  They will contact our office to request authorization. Prescriptions will not be filled after 5pm or on week-ends. 4. You should follow a light diet the first few days after arrival home, such as soup and crackers, etc.  Be sure to include lots of fluids daily. 5. Most patients will experience some swelling and bruising in the area of the incisions.  Ice packs will help.  Swelling and bruising can take several days to resolve.  6. It is common to experience some constipation if taking pain medication after surgery.  Increasing fluid intake and taking a stool softener (such as Colace) will usually help or prevent this problem from occurring.  A mild laxative (Milk of Magnesia or Miralax) should be taken according to package instructions if there are no bowel movements after 48 hours. 7. Unless discharge instructions indicate otherwise, you may remove your bandages 48 hours after surgery, and you may shower at that time.  You may have steri-strips (small skin tapes) in place directly over the incision.  These strips should be left on the skin for  7-10 days.  If your surgeon used skin glue on the incision, you may shower in 24 hours.  The glue will flake off over the next 2-3 weeks.  Any sutures or staples will be removed at the office during your follow-up visit. 8. ACTIVITIES:  You may resume regular (light) daily activities beginning the next day--such as daily self-care, walking, climbing stairs--gradually increasing activities as tolerated.  You may have sexual intercourse when it is comfortable.  Refrain from any heavy lifting or straining until approved by your doctor. a. You may drive when you are no longer taking prescription pain medication, you can comfortably wear a seatbelt, and you can safely maneuver your car and apply brakes. b. RETURN TO WORK:  __________________________________________________________ 9. You should see your doctor in the office for a follow-up appointment approximately 2-3 weeks after your surgery.  Make sure that you call for this appointment within a day or two after you arrive home to insure a convenient appointment time. 10. OTHER INSTRUCTIONS: __________________________________________________________________________________________________________________________ __________________________________________________________________________________________________________________________ WHEN TO CALL YOUR DOCTOR: 1. Fever over 101.0 2. Inability to urinate 3. Continued bleeding from incision. 4. Increased pain, redness, or drainage from the incision. 5. Increasing abdominal pain  The clinic staff is available to answer your questions during regular business hours.  Please don't hesitate to call and ask to speak to one of the nurses for clinical concerns.  If you have a medical emergency, go to the nearest emergency room or call 911.  A surgeon from [facility] is always on call at the hospital. [address]  [address], [place], [place], [place]  [address] ? [address], [place], [place] [phone] ? [phone] ? FAX [phone] Web site: [email or web address]    Post Anesthesia Home Care Instructions  Activity: Get plenty of rest for the remainder of the day. A responsible individual must stay with you for 24 hours following the procedure.  For the next 24 hours, DO NOT: -Drive a car -Paediatric nurse -Drink alcoholic beverages -Take any medication unless instructed by your physician -Make any legal decisions or sign important papers.  Meals: Start with liquid foods such as gelatin or soup. Progress to regular foods as tolerated. Avoid greasy, spicy, heavy foods. If nausea and/or vomiting occur, drink only clear liquids until the nausea and/or vomiting subsides. Call your physician if vomiting continues.  Special Instructions/Symptoms: Your throat may feel dry or sore from the anesthesia or the breathing tube placed in your throat during surgery. If this causes discomfort, gargle with warm salt water. The discomfort should disappear within 24 hours.  If you had a scopolamine patch placed behind your ear for the management of post- operative nausea and/or vomiting:  1. The medication in the patch is effective for 72 hours, after which it should be removed.  Wrap patch in a tissue and discard in the trash. Wash hands thoroughly with soap and water. 2. You may remove the patch earlier than 72 hours if you experience unpleasant side effects which may include dry mouth, dizziness or visual disturbances. 3. Avoid touching the patch. Wash your hands with soap and water after contact with the patch.  Information for Discharge Teaching: EXPAREL (bupivacaine liposome injectable suspension)   Your surgeon or anesthesiologist gave you EXPAREL(bupivacaine) to help control your pain after surgery.   EXPAREL is a local anesthetic that provides pain relief by numbing the tissue around the surgical site.  EXPAREL is designed to release pain medication  over time and can control pain for up to 72 hours.  Depending on how you respond to EXPAREL, you may require less pain medication during your recovery.  Possible side effects:  Temporary loss of sensation or ability to move in the area where bupivacaine was injected.  Nausea, vomiting, constipation  Rarely, numbness and tingling in your mouth or lips, lightheadedness, or anxiety may occur.  Call your doctor right away if you think you may be experiencing any of these sensations, or if you have other questions regarding possible side effects.  Follow all other discharge instructions given to you by your surgeon or nurse. Eat a healthy diet and drink plenty of water or other fluids.  If you return to the hospital for any reason within 96 hours following the administration of EXPAREL, it is important for health care providers to know that you have received this anesthetic. A teal colored band has been placed on your arm with the date, time and amount of EXPAREL you have received in order to alert and inform your health care providers. Please leave this armband in place for the full 96 hours following administration, and then you may remove the band.

## 2019-03-25 NOTE — Anesthesia Preprocedure Evaluation (Addendum)
Anesthesia Evaluation  Patient identified by MRN, date of birth, ID band Patient awake    Reviewed: Allergy & Precautions, NPO status , Patient's Chart, lab work & pertinent test results  Airway Mallampati: I  TM Distance: >3 FB Neck ROM: Full    Dental  (+) Teeth Intact, Dental Advisory Given   Pulmonary    breath sounds clear to auscultation       Cardiovascular  Rhythm:Regular Rate:Normal     Neuro/Psych    GI/Hepatic   Endo/Other    Renal/GU      Musculoskeletal   Abdominal   Peds  Hematology   Anesthesia Other Findings   Reproductive/Obstetrics                            Anesthesia Physical Anesthesia Plan  ASA: II  Anesthesia Plan: General   Post-op Pain Management:    Induction: Intravenous  PONV Risk Score and Plan: Ondansetron and Dexamethasone  Airway Management Planned: Oral ETT  Additional Equipment:   Intra-op Plan:   Post-operative Plan: Extubation in OR  Informed Consent: I have reviewed the patients History and Physical, chart, labs and discussed the procedure including the risks, benefits and alternatives for the proposed anesthesia with the patient or authorized representative who has indicated his/her understanding and acceptance.     Dental advisory given  Plan Discussed with: CRNA and Anesthesiologist  Anesthesia Plan Comments:         Anesthesia Quick Evaluation

## 2019-03-25 NOTE — Anesthesia Procedure Notes (Signed)
Anesthesia Regional Block: TAP block   Pre-Anesthetic Checklist: ,, timeout performed, Correct Patient, Correct Site, Correct Laterality, Correct Procedure, Correct Position, site marked, Risks and benefits discussed, pre-op evaluation,  At surgeon's request and post-op pain management  Laterality: Left  Prep: Maximum Sterile Barrier Precautions used, chloraprep       Needles:  Injection technique: Single-shot  Needle Type: Echogenic Stimulator Needle     Needle Length: 9cm  Needle Gauge: 21     Additional Needles:   Procedures:,,,, ultrasound used (permanent image in chart),,,,  Narrative:  Start time: 03/25/2019 10:45 AM End time: 03/25/2019 10:50 AM Injection made incrementally with aspirations every 5 mL.  Performed by: Personally  Anesthesiologist: Roberts Gaudy, MD  Additional Notes: 20 cc 0.25% Bupivacaine 1:200 epi

## 2019-03-25 NOTE — Transfer of Care (Signed)
Immediate Anesthesia Transfer of Care Note  Patient: Johnny Riggs  Procedure(s) Performed: LAPAROSCOPIC CHOLECYSTECTOMY (N/A Abdomen)  Patient Location: PACU  Anesthesia Type:General  Level of Consciousness: sedated  Airway & Oxygen Therapy: Patient Spontanous Breathing and Patient connected to face mask oxygen  Post-op Assessment: Report given to RN and Post -op Vital signs reviewed and stable  Post vital signs: Reviewed and stable  Last Vitals:  Vitals Value Taken Time  BP 153/99 03/25/19 1232  Temp    Pulse 79 03/25/19 1235  Resp 18 03/25/19 1235  SpO2 100 % 03/25/19 1235  Vitals shown include unvalidated device data.  Last Pain:  Vitals:   03/25/19 0942  TempSrc: Temporal  PainSc: 0-No pain         Complications: No apparent anesthesia complications

## 2019-03-25 NOTE — Progress Notes (Signed)
Assisted Dr. Linna Caprice with right, left, ultrasound guided, transabdominal plane block. Side rails up, monitors on throughout procedure. See vital signs in flow sheet. Tolerated Procedure well.

## 2019-03-25 NOTE — Anesthesia Procedure Notes (Signed)
Anesthesia Regional Block: TAP block   Pre-Anesthetic Checklist: ,, timeout performed, Correct Patient, Correct Site, Correct Laterality, Correct Procedure, Correct Position, site marked, Risks and benefits discussed, pre-op evaluation,  At surgeon's request and post-op pain management  Laterality: Right  Prep: Maximum Sterile Barrier Precautions used, chloraprep       Needles:  Injection technique: Single-shot  Needle Type: Echogenic Stimulator Needle     Needle Length: 9cm  Needle Gauge: 21     Additional Needles:   Narrative:  Start time: 03/25/2019 10:45 AM End time: 03/25/2019 10:50 AM Injection made incrementally with aspirations every 5 mL.  Performed by: Personally  Anesthesiologist: Roberts Gaudy, MD  Additional Notes: 25 cc 0.25% Bupivacaine 10 cc 1.3% exparel

## 2019-03-25 NOTE — Anesthesia Procedure Notes (Signed)
Procedure Name: Intubation Date/Time: 03/25/2019 11:21 AM Performed by: Maryella Shivers, CRNA Pre-anesthesia Checklist: Patient identified, Emergency Drugs available, Suction available and Patient being monitored Patient Re-evaluated:Patient Re-evaluated prior to induction Oxygen Delivery Method: Circle system utilized Preoxygenation: Pre-oxygenation with 100% oxygen Induction Type: IV induction Ventilation: Mask ventilation without difficulty Laryngoscope Size: Mac and 3 Grade View: Grade I Tube type: Oral Tube size: 7.5 mm Number of attempts: 1 Airway Equipment and Method: Stylet and Oral airway Placement Confirmation: ETT inserted through vocal cords under direct vision,  positive ETCO2 and breath sounds checked- equal and bilateral Secured at: 21 cm Tube secured with: Tape Dental Injury: Teeth and Oropharynx as per pre-operative assessment

## 2019-03-25 NOTE — Interval H&P Note (Signed)
History and Physical Interval Note:  03/25/2019 10:39 AM  Johnny Riggs [facility]  has presented today for surgery, with the diagnosis of CHOLECYSTITIS.  The various methods of treatment have been discussed with the patient and family. After consideration of risks, benefits and other options for treatment, the patient has consented to  Procedure(s): LAPAROSCOPIC CHOLECYSTECTOMY (N/A) as a surgical intervention.  The patient's history has been reviewed, patient examined, no change in status, stable for surgery.  I have reviewed the patient's chart and labs.  Questions were answered to the patient's satisfaction.     Rolm Bookbinder

## 2019-03-25 NOTE — Op Note (Signed)
Preoperative diagnosis: Symptomatic cholelithiasis, possible mass Postoperative diagnosis: Same as above Procedure: Laparoscopic cholecystectomy Surgeon: Dr. Serita Grammes Estimated blood loss: Minimal Complications: None Drains: None Specimens: Gallbladder and contents to pathology Sponge needle count was correct at completion Disposition to recovery in stable condition  Indications:43 yof has had some central abd pain after eating that goes away on own for some time that over the past six months has become more frequent. this occurs after eating especially fatty foods and then passes on own. has been so bad he almost went to er a couple times. no n/v. ppis not really helping. nl bms. has egd and colo fairly unremarkable. he has [place] that shows 2 cm gallstone and question of a mass. cbd nl. this was followed by an mr of abdomen that shows cholelithiasis with likely sludge - cannot rule out polyp for sure. he is here to discuss options. We planned for laparoscopic cholecystectomy.   Procedure: After informed consent was obtained the patient was taken to the operating room.  He was given antibiotics.  He had SCDs in place. He underwent a bilateral TAP block with anesthesia.   He was placed under general anesthesia without complication.  He was prepped and draped in the standard sterile surgical fashion.  A surgical timeout was then performed.  I infiltrated Marcaine below his umbilicus.  I made a vertical incision.  I grasped the fascia.  I then incised the fascia.  I entered the peritoneum bluntly.  I placed a 0 Vicryl pursestring suture through the fascia.  I then inserted a Hassan trocar and insufflated the abdomen to 15 mmHg pressure.  This was done without complication.  I then placed 3 further 5 mm trocars in the epigastrium and right upper quadrant.  This was done under direct vision without complication.  His gallbladder was noted to have evidence of prior inflammation.   Once I had  done this I was able to obtain the critical view of safety.  Both the cystic artery and cystic duct were clearly going into the gallbladder.  The liver was visible behind that.  I then clipped the artery 3 times and divided it leaving 2 clips in place.  I then clipped the cystic duct 3 times proximally and one distally.  I divided this leaving 3 clips in place.  The clips completely traversed the duct and the duct was viable.  I then removed the gallbladder from the liver bed.  This was placed in a retrieval bag and removed.  I then obtained hemostasis.  Irrigation was performed.  I then removed my Hassan trocar.  I tied my pursestring down.  I placed an additional Vicryl suture using the suture passer to completely obliterate the umbilical defect.  I then remove the remaining trochars and desufflated the abdomen.  The incisions were closed with 4-0 Monocryl and glue.  He tolerated this well was extubated and transferred to recovery in stable condition.

## 2019-03-25 NOTE — Anesthesia Postprocedure Evaluation (Signed)
Anesthesia Post Note  Patient: Johnny Riggs  Procedure(s) Performed: LAPAROSCOPIC CHOLECYSTECTOMY (N/A Abdomen)     Patient location during evaluation: PACU Anesthesia Type: General Level of consciousness: awake and alert Pain management: pain level controlled Vital Signs Assessment: post-procedure vital signs reviewed and stable Respiratory status: spontaneous breathing, nonlabored ventilation, respiratory function stable and patient connected to nasal cannula oxygen Cardiovascular status: blood pressure returned to baseline and stable Postop Assessment: no apparent nausea or vomiting Anesthetic complications: no    Last Vitals:  Vitals:   03/25/19 1300 03/25/19 1315  BP: (!) 143/95 139/89  Pulse: 79 86  Resp: 16 18  Temp:    SpO2: 100% 100%    Last Pain:  Vitals:   03/25/19 1300  TempSrc:   PainSc: 8                  Barnet Glasgow

## 2019-03-26 NOTE — Addendum Note (Signed)
Addendum  created 03/26/19 [id number] by Aza Dantes, Ernesta Amble, CRNA   Charge Capture section accepted

## 2019-03-27 ENCOUNTER — Encounter: Payer: Self-pay | Admitting: *Deleted

## 2019-03-31 LAB — SURGICAL PATHOLOGY

## 2019-06-10 MED FILL — OMEPRAZOLE DR 20 MG CAPSULE: 20 | 90 days supply | Qty: 90 | Fill #1

## 2019-07-15 ENCOUNTER — Other Ambulatory Visit: Payer: Self-pay

## 2019-07-15 ENCOUNTER — Emergency Department (INDEPENDENT_AMBULATORY_CARE_PROVIDER_SITE_OTHER)
Admission: RE | Admit: 2019-07-15 | Discharge: 2019-07-15 | Disposition: A | Discharge status: Home / Self Care | Payer: 59 | Source: Ambulatory Visit | Attending: Family Medicine | Admitting: Family Medicine

## 2019-07-15 VITALS — BP 127/83 | HR 112 | Temp 98.8°F | Resp 16

## 2019-07-15 DIAGNOSIS — J029 Acute pharyngitis, unspecified: Secondary | ICD-10-CM

## 2019-07-15 DIAGNOSIS — J069 Acute upper respiratory infection, unspecified: Secondary | ICD-10-CM

## 2019-07-15 LAB — POCT RAPID STREP A (OFFICE): Rapid Strep A Screen: NEGATIVE

## 2019-07-15 NOTE — ED Provider Notes (Signed)
[facility]    CSN: [id number] Arrival date & time: 07/15/19  1013      History   Chief Complaint Chief Complaint  Patient presents with  . Appointment  . Fever  . Sore Throat    HPI Johnny Riggs is a 44 y.o. male.   Patient complains of sore throat for two days.  Yesterday he developed myalgias, headache, low grade fever, and a mild cough.  His 74 year old daughter developed similar symptoms 3 day ago.  He had COVID19 vaccine last December.   The history is provided by the patient.    Past Medical History:  Diagnosis Date  . Anal fissure    25 years ago   . GERD (gastroesophageal reflux disease)   . Hyperlipidemia   . Low vitamin D level     Patient Active Problem List   Diagnosis Date Noted  . Heartburn 03/05/2019  . Abdominal bloating 03/05/2019  . History of Helicobacter pylori infection 03/05/2019  . Family history of colonic polyps 03/05/2019    Past Surgical History:  Procedure Laterality Date  . CHOLECYSTECTOMY N/A 03/25/2019   Procedure: LAPAROSCOPIC CHOLECYSTECTOMY;  Surgeon: Rolm Bookbinder, MD;  Location: [facility];  Service: General;  Laterality: N/A;  . CYST EXCISION N/A 03/11/2018   Procedure: EXCISION OF PERINEAL CYST;  Surgeon: Clovis Riley, MD;  Location: [facility];  Service: General;  Laterality: N/A;  . ESOPHAGOGASTRODUODENOSCOPY     over 25 years ago. In [place]  . NO PAST SURGERIES         Home Medications    Prior to Admission medications   Medication Sig Start Date End Date Taking? Authorizing Provider  Cholecalciferol (VITAMIN D) 50 MCG (2000 UT) tablet Take 2,000 Units by mouth daily.   Yes [provider]  omeprazole (PRILOSEC) 20 MG capsule Take 1 capsule (20 mg total) by mouth daily. 03/07/19  Yes Jackquline Denmark, MD  oxyCODONE (OXY IR/ROXICODONE) 5 MG immediate release tablet Take 1 tablet (5 mg total) by mouth every 6 (six) hours as needed for moderate pain, severe pain  or breakthrough pain. 03/25/19   Rolm Bookbinder, MD    Family History Family History  Problem Relation Age of Onset  . Diabetes Mother   . Irritable bowel syndrome Mother   . Colon polyps Father   . Esophageal cancer Neg Hx   . Colon cancer Neg Hx     Social History Social History   Tobacco Use  . Smoking status: Never Smoker  . Smokeless tobacco: Never Used  Vaping Use  . Vaping Use: Never used  Substance Use Topics  . Alcohol use: Not Currently    Comment: occas  . Drug use: Never     Allergies   Patient has no known allergies.   Review of Systems Review of Systems + sore throat + cough No pleuritic pain No wheezing ? nasal congestion ? post-nasal drainage No sinus pain/pressure No itchy/red eyes No earache No hemoptysis No SOB + low grade fever  No nausea No vomiting No abdominal pain No diarrhea No urinary symptoms No skin rash No fatigue + myalgias + headache    Physical Exam Triage Vital Signs ED Triage Vitals  Enc Vitals Group     BP 07/15/19 1036 127/83     Pulse Rate 07/15/19 1036 (!) 112     Resp 07/15/19 1036 16     Temp 07/15/19 1036 98.8 F (37.1 C)  Temp Source 07/15/19 1036 Oral     SpO2 07/15/19 1036 97 %     Weight --      Height --      Head Circumference --      Peak Flow --      Pain Score 07/15/19 1037 3     Pain Loc --      Pain Edu? --      Excl. in [place]? --    No data found.  Updated Vital Signs BP 127/83 (BP Location: Right Arm)   Pulse (!) 112   Temp 98.8 F (37.1 C) (Oral)   Resp 16   SpO2 97%   Visual Acuity Right Eye Distance:   Left Eye Distance:   Bilateral Distance:    Right Eye Near:   Left Eye Near:    Bilateral Near:     Physical Exam Nursing notes and Vital Signs reviewed. Appearance:  Patient appears stated age, and in no acute distress Eyes:  Pupils are equal, round, and reactive to light and accomodation.  Extraocular movement is intact.  Conjunctivae are not inflamed  Ears:   Canals normal.  Tympanic membranes normal.  Nose:  Mildly congested turbinates.  No sinus tenderness.  Pharynx: Minimal erythema.  Neck:  Supple.  Mildly enlarged lateral nodes are present, tender to palpation on the left.   Lungs:  Clear to auscultation.  Breath sounds are equal.  Moving air well. Heart:  Regular rate and rhythm without murmurs, rubs, or gallops.  Abdomen:  Nontender without masses or hepatosplenomegaly.  Bowel sounds are present.  No CVA or flank tenderness.  Extremities:  No edema.  Skin:  No rash present.   UC Treatments / Results  Labs (all labs ordered are listed, but only abnormal results are displayed) Labs Reviewed  STREP A DNA PROBE  POCT RAPID STREP A (OFFICE) negative    EKG   Radiology No results found.  Procedures Procedures (including critical care time)  Medications Ordered in UC Medications - No data to display  Initial Impression / Assessment and Plan / UC Course  I have reviewed the triage vital signs and the nursing notes.  Pertinent labs & imaging results that were available during my care of the patient were reviewed by me and considered in my medical decision making (see chart for details).    Benign exam. CENTOR 0.  There is no evidence of bacterial infection today.  Treat symptomatically for now. Followup with Family Doctor if not improved in about 10 days.   Final Clinical Impressions(s) / UC Diagnoses   Final diagnoses:  Acute pharyngitis, unspecified etiology  Viral URI with cough     Discharge Instructions     Take plain guaifenesin (1200mg  extended release tabs such as Mucinex) twice daily, with plenty of water, for cough and congestion.  May add Pseudoephedrine (30mg , one or two every 4 to 6 hours) for sinus congestion.  Get adequate rest.   May use Afrin nasal spray (or generic oxymetazoline) each morning for about 5 days and then discontinue.  Also recommend using saline nasal spray several times daily and saline  nasal irrigation (AYR is a common brand).  Use Flonase nasal spray each morning after using Afrin nasal spray and saline nasal irrigation. Try warm salt water gargles for sore throat.  Stop all antihistamines for now, and other non-prescription cough/cold preparations. May take Tylenol as needed for sore throat, body aches, etc. May take Delsym Cough Suppressant at bedtime for nighttime  cough.     ED Prescriptions    None        Kandra Nicolas, MD 07/17/19 2042

## 2019-07-15 NOTE — Discharge Instructions (Addendum)
Take plain guaifenesin (1200mg  extended release tabs such as Mucinex) twice daily, with plenty of water, for cough and congestion.  May add Pseudoephedrine (30mg , one or two every 4 to 6 hours) for sinus congestion.  Get adequate rest.   May use Afrin nasal spray (or generic oxymetazoline) each morning for about 5 days and then discontinue.  Also recommend using saline nasal spray several times daily and saline nasal irrigation (AYR is a common brand).  Use Flonase nasal spray each morning after using Afrin nasal spray and saline nasal irrigation. Try warm salt water gargles for sore throat.  Stop all antihistamines for now, and other non-prescription cough/cold preparations. May take Tylenol as needed for sore throat, body aches, etc. May take Delsym Cough Suppressant at bedtime for nighttime cough.

## 2019-07-15 NOTE — ED Triage Notes (Signed)
Sore throat (sharp pain ) since Sunday night -daughter (26yrs old)had similar symptoms on Friday, has resolved Tmax at home this am was 100 Took Dayquil this am - relief provided [employer] in December

## 2019-07-16 LAB — STREP A DNA PROBE: Group A Strep Probe: NOT DETECTED

## 2019-09-18 MED FILL — OMEPRAZOLE DR 20 MG CAPSULE: 20 | 90 days supply | Qty: 90 | Fill #2

## 2019-10-06 DIAGNOSIS — Z23 Encounter for immunization: Secondary | ICD-10-CM | POA: Diagnosis not present

## 2019-11-12 DIAGNOSIS — H52223 Regular astigmatism, bilateral: Secondary | ICD-10-CM | POA: Diagnosis not present

## 2019-11-12 DIAGNOSIS — H5213 Myopia, bilateral: Secondary | ICD-10-CM | POA: Diagnosis not present

## 2019-11-12 DIAGNOSIS — H524 Presbyopia: Secondary | ICD-10-CM | POA: Diagnosis not present

## 2019-11-27 ENCOUNTER — Encounter: Payer: Self-pay | Admitting: Medical

## 2019-11-30 ENCOUNTER — Telehealth: Payer: Self-pay | Admitting: Medical

## 2019-11-30 DIAGNOSIS — E1169 Type 2 diabetes mellitus with other specified complication: Secondary | ICD-10-CM

## 2019-11-30 DIAGNOSIS — R7989 Other specified abnormal findings of blood chemistry: Secondary | ICD-10-CM

## 2019-11-30 NOTE — Telephone Encounter (Signed)
Future labs placed. 

## 2019-12-02 ENCOUNTER — Other Ambulatory Visit: Payer: Self-pay

## 2019-12-02 ENCOUNTER — Ambulatory Visit: Payer: 59 | Admitting: Medical

## 2019-12-02 ENCOUNTER — Encounter: Payer: Self-pay | Admitting: Medical

## 2019-12-02 ENCOUNTER — Telehealth: Payer: Self-pay | Admitting: Medical

## 2019-12-02 VITALS — BP 120/70 | HR 72 | Temp 97.9°F | Resp 16 | Ht 66.0 in | Wt 147.2 lb

## 2019-12-02 DIAGNOSIS — R5383 Other fatigue: Secondary | ICD-10-CM | POA: Diagnosis not present

## 2019-12-02 DIAGNOSIS — E785 Hyperlipidemia, unspecified: Secondary | ICD-10-CM | POA: Diagnosis not present

## 2019-12-02 DIAGNOSIS — R0683 Snoring: Secondary | ICD-10-CM | POA: Diagnosis not present

## 2019-12-02 DIAGNOSIS — E1169 Type 2 diabetes mellitus with other specified complication: Secondary | ICD-10-CM

## 2019-12-02 DIAGNOSIS — R7989 Other specified abnormal findings of blood chemistry: Secondary | ICD-10-CM | POA: Diagnosis not present

## 2019-12-02 LAB — COMPREHENSIVE METABOLIC PANEL
ALT: 18 U/L (ref 0–53)
AST: 16 U/L (ref 0–37)
Albumin: 4.2 g/dL (ref 3.5–5.2)
Alkaline Phosphatase: 81 U/L (ref 39–117)
BUN: 16 mg/dL (ref 6–23)
CO2: 32 mEq/L (ref 19–32)
Calcium: 9.3 mg/dL (ref 8.4–10.5)
Chloride: 102 mEq/L (ref 96–112)
Creatinine, Ser: 0.96 mg/dL (ref 0.40–1.50)
GFR: 95 mL/min (ref >60.00)
Glucose, Bld: 86 mg/dL (ref 70–99)
Potassium: 4.8 mEq/L (ref 3.5–5.1)
Sodium: 139 mEq/L (ref 135–145)
Total Bilirubin: 0.9 mg/dL (ref 0.2–1.2)
Total Protein: 6.8 g/dL (ref 6.0–8.3)

## 2019-12-02 LAB — LIPID PANEL
Cholesterol: 195 mg/dL (ref 0–200)
HDL: 39.1 mg/dL (ref >39.00)
LDL Cholesterol: 134 mg/dL — ABNORMAL HIGH (ref 0–99)
NonHDL: 155.58
Total CHOL/HDL Ratio: 5
Triglycerides: 107 mg/dL (ref 0.0–149.0)
VLDL: 21.4 mg/dL (ref 0.0–40.0)

## 2019-12-02 NOTE — Telephone Encounter (Signed)
Dr. Elsworth Soho,  I am seeing Dr. Dante Gang. He talked to you about snoring I put in the order and wanted to make you aware. I don't order at home studies a lot. Which company do you use. Will have study sent to you.   Thanks for your help.  Johnny Riggs

## 2019-12-02 NOTE — Telephone Encounter (Signed)
Johnny Riggs, You can place home sleep study order under [facility] and we will pick it up In comments section, you can write Dr. Elsworth Soho to read Thanks,  [name]

## 2019-12-02 NOTE — Telephone Encounter (Signed)
Dr Elsworth Soho,  [facility]. Appreciate your help.  Johnny Riggs

## 2019-12-02 NOTE — Patient Instructions (Addendum)
For high cholesterol continue healthy diet and exercise. Repeat lipid panel and cmp. If ldl increases past 140 will prescribe statin as you mentioned would be willing to start.  For low vit d repeat level today.  For snoring went ahead and placed at home sleep study order. Update Dr. Elsworth Soho.  For gerd continue prilosec.  Follow up date to be determined after lab review.

## 2019-12-02 NOTE — Progress Notes (Signed)
Subjective:    Patient ID: Johnny Riggs, male    DOB: 04-13-1975, 44 y.o.   MRN: [id number]  HPI   Pt in for follow up.  He is fasting. Hx of mild high cholesterol. Vit D level low in past.  Will get labs today.  Pt states he snores all his life. He wakes up multiple times at night. He states feeling tired/not well rested. Waking 15 minutes. Wife thinks he is stopping breathing. BMI is on lower end.  Pt had gerd. Still taking prilosec.    Review of Systems  Constitutional: Negative for chills, fatigue and fever.  HENT: Negative for congestion, drooling and facial swelling.   Respiratory: Negative for cough, chest tightness, shortness of breath and wheezing.   Cardiovascular: Negative for chest pain and palpitations.  Gastrointestinal: Negative for abdominal distention and anal bleeding.  Genitourinary: Negative for dysuria and frequency.  Musculoskeletal: Negative for back pain and neck pain.  Skin: Negative for rash.  Neurological: Negative for dizziness and headaches.  Hematological: Negative for adenopathy. Does not bruise/bleed easily.  Psychiatric/Behavioral: Negative for behavioral problems and confusion. The patient is not nervous/anxious.     Past Medical History:  Diagnosis Date  . Anal fissure    25 years ago   . GERD (gastroesophageal reflux disease)   . Hyperlipidemia   . Low vitamin D level      Social History   Socioeconomic History  . Marital status: Married    Spouse name: Not on file  . Number of children: 2  . Years of education: Not on file  . Highest education level: Not on file  Occupational History  . Occupation: hospitalist  Tobacco Use  . Smoking status: Never Smoker  . Smokeless tobacco: Never Used  Vaping Use  . Vaping Use: Never used  Substance and Sexual Activity  . Alcohol use: Not Currently    Comment: occas  . Drug use: Never  . Sexual activity: Yes  Other Topics Concern  . Not on file  Social History Narrative  .  Not on file   Social Determinants of Health   Financial Resource Strain:   . Difficulty of Paying Living Expenses: Not on file  Food Insecurity:   . Worried About Charity fundraiser in the Last Year: Not on file  . Ran Out of Food in the Last Year: Not on file  Transportation Needs:   . Lack of Transportation (Medical): Not on file  . Lack of Transportation (Non-Medical): Not on file  Physical Activity:   . Days of Exercise per Week: Not on file  . Minutes of Exercise per Session: Not on file  Stress:   . Feeling of Stress : Not on file  Social Connections:   . Frequency of Communication with Friends and Family: Not on file  . Frequency of Social Gatherings with Friends and Family: Not on file  . Attends Religious Services: Not on file  . Active Member of Clubs or Organizations: Not on file  . Attends Archivist Meetings: Not on file  . Marital Status: Not on file  Intimate Partner Violence:   . Fear of Current or Ex-Partner: Not on file  . Emotionally Abused: Not on file  . Physically Abused: Not on file  . Sexually Abused: Not on file    Past Surgical History:  Procedure Laterality Date  . CHOLECYSTECTOMY N/A 03/25/2019   Procedure: LAPAROSCOPIC CHOLECYSTECTOMY;  Surgeon: Rolm Bookbinder, MD;  Location: [facility];  Service: General;  Laterality: N/A;  . CYST EXCISION N/A 03/11/2018   Procedure: EXCISION OF PERINEAL CYST;  Surgeon: Clovis Riley, MD;  Location: [facility];  Service: General;  Laterality: N/A;  . ESOPHAGOGASTRODUODENOSCOPY     over 25 years ago. In [place]  . NO PAST SURGERIES      Family History  Problem Relation Age of Onset  . Diabetes Mother   . Irritable bowel syndrome Mother   . Colon polyps Father   . Esophageal cancer Neg Hx   . Colon cancer Neg Hx     No Known Allergies  Current Outpatient Medications on File Prior to Visit  Medication Sig Dispense Refill  . Cholecalciferol (VITAMIN D) 50 MCG  (2000 UT) tablet Take 2,000 Units by mouth daily.    Marland Kitchen omeprazole (PRILOSEC) 20 MG capsule Take 1 capsule (20 mg total) by mouth daily. 90 capsule 4  . oxyCODONE (OXY IR/ROXICODONE) 5 MG immediate release tablet Take 1 tablet (5 mg total) by mouth every 6 (six) hours as needed for moderate pain, severe pain or breakthrough pain. 10 tablet 0   No current facility-administered medications on file prior to visit.    BP 138/78   Pulse 72   Temp 97.9 F (36.6 C) (Oral)   Resp 16   Ht 5\' 6"  (1.676 m)   Wt 147 lb 3.2 oz (66.8 kg)   SpO2 98%   BMI 23.76 kg/m       Objective:   Physical Exam  General Mental Status- Alert. General Appearance- Not in acute distress.   Skin General: Color- Normal Color. Moisture- Normal Moisture.  Neck Carotid Arteries- Normal color. Moisture- Normal Moisture. No carotid bruits. No JVD.  Chest and Lung Exam Auscultation: Breath Sounds:-Normal.  Cardiovascular Auscultation:Rythm- Regular. Murmurs & Other Heart Sounds:Auscultation of the heart reveals- No Murmurs.  Abdomen Inspection:-Inspeection Normal. Palpation/Percussion:Note:No mass. Palpation and Percussion of the abdomen reveal- Non Tender, Non Distended + BS, no rebound or guarding.  Neurologic Cranial Nerve exam:- CN III-XII intact(No nystagmus), symmetric smile. Strength:- 5/5 equal and symmetric strength both upper and lower extremities.      Assessment & Plan:  For high cholesterol continue healthy diet and exercise. Repeat lipid panel and cmp.  For low vit d repeat level today.  For snoring went ahead and placed at home sleep study order. Update Dr. Elsworth Soho.  Follow up date to be determined after lab review.  Mackie Pai, PA-C   Time spent with patient today was 32  minutes which consisted of chart review, discussing diagnoses, work up treatment and documentation.

## 2019-12-03 ENCOUNTER — Other Ambulatory Visit: Payer: Self-pay | Admitting: Medical

## 2019-12-03 ENCOUNTER — Encounter: Payer: Self-pay | Admitting: Medical

## 2019-12-03 ENCOUNTER — Telehealth: Payer: Self-pay | Admitting: Medical

## 2019-12-03 DIAGNOSIS — E7800 Pure hypercholesterolemia, unspecified: Secondary | ICD-10-CM

## 2019-12-03 MED ORDER — ROSUVASTATIN CALCIUM 5 MG PO TABS
5.0000 mg | ORAL_TABLET | Freq: Every day | ORAL | 0 refills | Status: DC
Start: 2019-12-03 — End: 2019-12-03

## 2019-12-03 MED FILL — ROSUVASTATIN CALCIUM 5 MG T: 5 | 90 days supply | Qty: 90 | Fill #0

## 2019-12-03 NOTE — Telephone Encounter (Signed)
Rx crestor sent to pt pharmacy. 

## 2019-12-03 NOTE — Telephone Encounter (Signed)
Rx crestor low dose sent to pt pharmacy.

## 2019-12-05 LAB — VITAMIN D 1,25 DIHYDROXY
Vitamin D 1, 25 (OH)2 Total: 65 pg/mL (ref 18–72)
Vitamin D2 1, 25 (OH)2: <8 pg/mL
Vitamin D3 1, 25 (OH)2: 65 pg/mL

## 2019-12-16 MED FILL — OMEPRAZOLE DR 20 MG CAPSULE: 20 | 90 days supply | Qty: 90 | Fill #3

## 2019-12-29 ENCOUNTER — Telehealth: Payer: Self-pay | Admitting: Medical

## 2019-12-29 DIAGNOSIS — R0683 Snoring: Secondary | ICD-10-CM

## 2019-12-29 NOTE — Telephone Encounter (Signed)
Dr. Elsworth Soho,  In late November, I placed at home sleep study. I put in referral that your office would do. 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  I am not sure of process. I don't think my sfaff never forwarded at home sleep studyrequest to your office.  Pt updated me today that he had not been contacted.  Is there a staff member in your office I could get my staff to coordinate with to send over the order.  Thanks,  Mackie Pai, PA-C

## 2019-12-29 NOTE — Telephone Encounter (Signed)
On pt last visit with me I out in order for at home sleep study. It is in body of the note.(order in plan section) Meant for the referral to go to Dr. Elsworth Soho office. Can you see that order. Can you print and fax to his office.   The order did not pop up in referral area.   I don't ever put in at home studies. So not sure how to communicate this to his office.  Will you call them and get help.  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

## 2019-12-29 NOTE — Telephone Encounter (Signed)
Opened to place the referral.

## 2019-12-29 NOTE — Telephone Encounter (Signed)
Opened to review at home sleep study order placed.

## 2019-12-30 ENCOUNTER — Telehealth: Payer: Self-pay | Admitting: Medical

## 2019-12-30 NOTE — Telephone Encounter (Signed)
I am asking my staff to contact you. Want to know if you can see the at home sleep study order I place on pt last visit with me in November.

## 2019-12-30 NOTE — Telephone Encounter (Signed)
Would you contact Willeen Cass at [facility] pulmonology. I put in at home sleep test on last visit. That order is visible in the note. Not sure if they saw that as pt was never scheduled. Would you verify that saw/picked up that order.   Thanks,

## 2019-12-30 NOTE — Telephone Encounter (Signed)
Johnny Riggs,can you co-ordinate with [facility] please & ensure that this order gets picked up? I can read

## 2020-01-05 ENCOUNTER — Telehealth: Payer: Self-pay | Admitting: Medical

## 2020-01-05 DIAGNOSIS — R0683 Snoring: Secondary | ICD-10-CM

## 2020-01-05 NOTE — Telephone Encounter (Signed)
Ok. Referral placed.  Thanks.

## 2020-01-05 NOTE — Telephone Encounter (Signed)
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 Dr. Vassie Loll passed message to pt and told me that all I have to do is put in the at home sleep study order. His office would pick up and arrange for the study to be done. So will you have them ask him directly.   Thanks,   I think Dr. Vassie Loll sent message to person named Victorino Dike about looking out for the order.

## 2020-01-05 NOTE — Telephone Encounter (Signed)
See other tel note

## 2020-01-05 NOTE — Telephone Encounter (Signed)
Referral placed.

## 2020-01-05 NOTE — Telephone Encounter (Signed)
I spoke with Victorino Dike who saw the order for the home sleep study. She said that she sends those to the patient care coordinators (referral coordinators). They need a referral to be placed in order to schedule the pt.

## 2020-01-05 NOTE — Telephone Encounter (Signed)
I have called [facility] and they said the pt is not established. They requested a referral to be placed to pulmonary/sleep medicine for snoring.

## 2020-01-14 ENCOUNTER — Telehealth: Payer: Self-pay | Admitting: General Practice

## 2020-01-14 NOTE — Telephone Encounter (Signed)
I have this order and will resc patient

## 2020-02-06 ENCOUNTER — Other Ambulatory Visit: Payer: Self-pay

## 2020-02-06 ENCOUNTER — Ambulatory Visit: Payer: 59

## 2020-02-06 DIAGNOSIS — G4733 Obstructive sleep apnea (adult) (pediatric): Secondary | ICD-10-CM | POA: Diagnosis not present

## 2020-02-06 DIAGNOSIS — D225 Melanocytic nevi of trunk: Secondary | ICD-10-CM | POA: Diagnosis not present

## 2020-02-06 DIAGNOSIS — L820 Inflamed seborrheic keratosis: Secondary | ICD-10-CM | POA: Diagnosis not present

## 2020-02-10 DIAGNOSIS — G4733 Obstructive sleep apnea (adult) (pediatric): Secondary | ICD-10-CM | POA: Diagnosis not present

## 2020-03-18 ENCOUNTER — Other Ambulatory Visit: Payer: Self-pay | Admitting: Gastroenterology

## 2020-03-18 DIAGNOSIS — K219 Gastro-esophageal reflux disease without esophagitis: Secondary | ICD-10-CM

## 2020-03-18 MED FILL — OMEPRAZOLE DR 20 MG CAPSULE: 20 | 90 days supply | Qty: 90 | Fill #0

## 2020-04-07 ENCOUNTER — Other Ambulatory Visit (HOSPITAL_COMMUNITY): Payer: Self-pay | Admitting: *Deleted

## 2020-04-14 ENCOUNTER — Other Ambulatory Visit: Payer: Self-pay

## 2020-04-14 ENCOUNTER — Ambulatory Visit (HOSPITAL_BASED_OUTPATIENT_CLINIC_OR_DEPARTMENT_OTHER)
Admission: RE | Admit: 2020-04-14 | Discharge: 2020-04-14 | Disposition: A | Discharge status: Home / Self Care | Payer: Self-pay | Source: Ambulatory Visit | Attending: Cardiology | Admitting: Cardiology

## 2020-05-04 ENCOUNTER — Encounter: Payer: Self-pay | Admitting: Medical

## 2020-05-05 ENCOUNTER — Other Ambulatory Visit (HOSPITAL_COMMUNITY): Payer: Self-pay

## 2020-05-05 ENCOUNTER — Telehealth: Payer: Self-pay | Admitting: Medical

## 2020-05-05 MED ORDER — MEFLOQUINE HCL 250 MG PO TABS
250.0000 mg | ORAL_TABLET | ORAL | 0 refills | Status: DC
  Filled 2020-05-05: qty 10, 70d supply, fill #0

## 2020-05-05 NOTE — Telephone Encounter (Signed)
Opened to send medication to pharmacy.

## 2020-05-05 NOTE — Telephone Encounter (Signed)
Rx mefloquine sent to pt pharmacy. See my chart request.

## 2020-05-06 ENCOUNTER — Other Ambulatory Visit (HOSPITAL_COMMUNITY): Payer: Self-pay

## 2020-05-07 ENCOUNTER — Other Ambulatory Visit (HOSPITAL_COMMUNITY): Payer: Self-pay

## 2021-04-13 IMAGING — CT CT CARDIAC CORONARY ARTERY CALCIUM SCORE
2 series · 15 of 20 positions shown, 17 images · non-contrast
Comparison: None.
COMPARISON: None.

Addendum:
EXAM:
OVER-READ INTERPRETATION  CT CHEST

The following report is an over-read performed by radiologist Dr.
Margie Henandez [REDACTED] on 04/14/2020. This over-read
does not include interpretation of cardiac or coronary anatomy or
pathology. The coronary calcium score interpretation by the
cardiologist is attached.
CLINICAL DATA: Cardiovascular Disease Risk stratification
Coronary Calcium Score
TECHNIQUE: A gated, non-contrast computed tomography scan of the heart was
performed using 3mm slice thickness. Axial images were analyzed on a
dedicated workstation. Calcium scoring of the coronary arteries was
performed using the Agatston method.

[Series 2: casc 3.0 i36f 2 bestdiast 68 % · axial · 0.39mm/px · z∈[-242,-137]mm · 8 of 45 slices shown, 10 images]
[im 5/45  vessel]
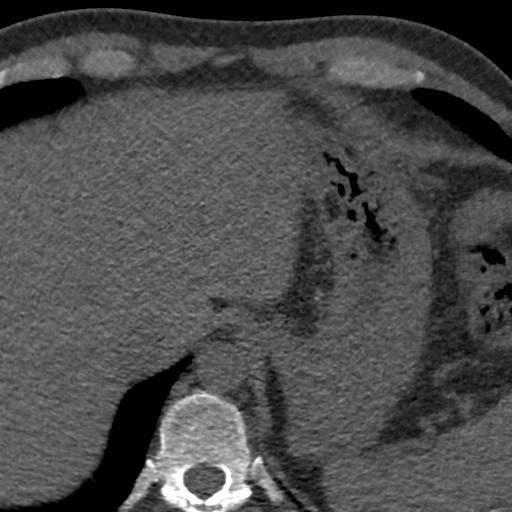
[im 5/45  lung]
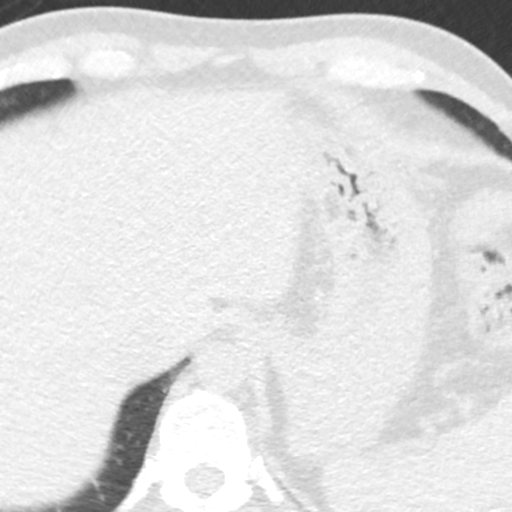
[im 10/45  vessel]
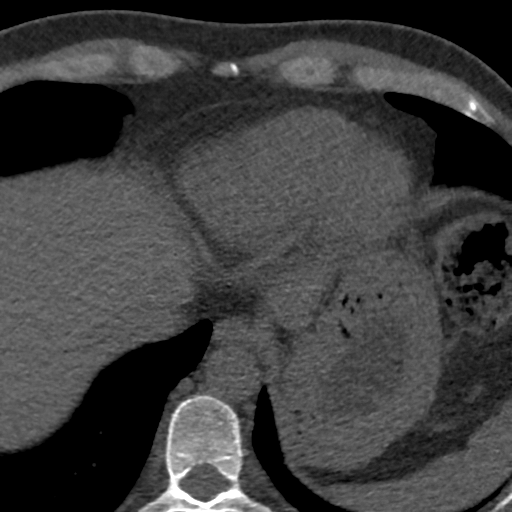
[im 15/45  vessel]
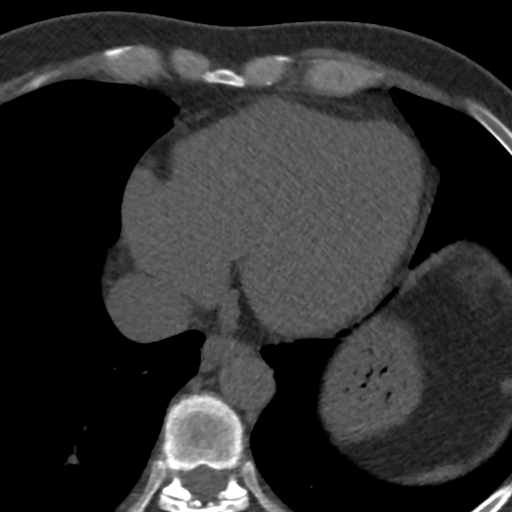
[im 20/45  vessel]
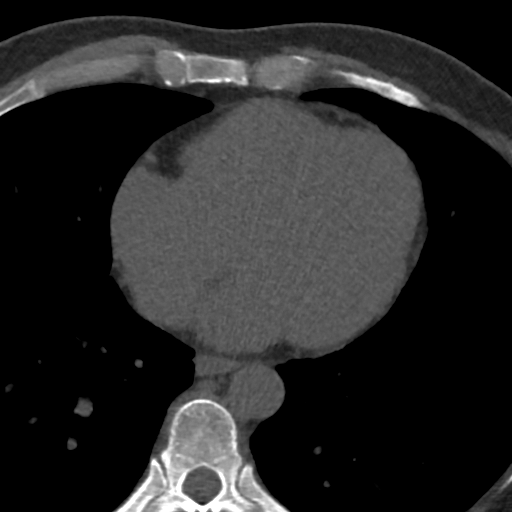
[im 25/45  vessel]
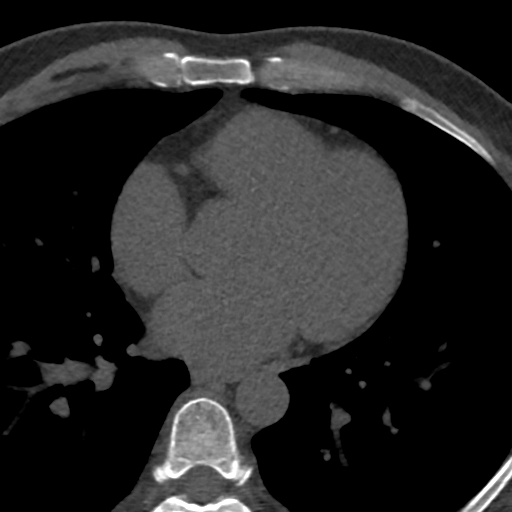
[im 25/45  lung]
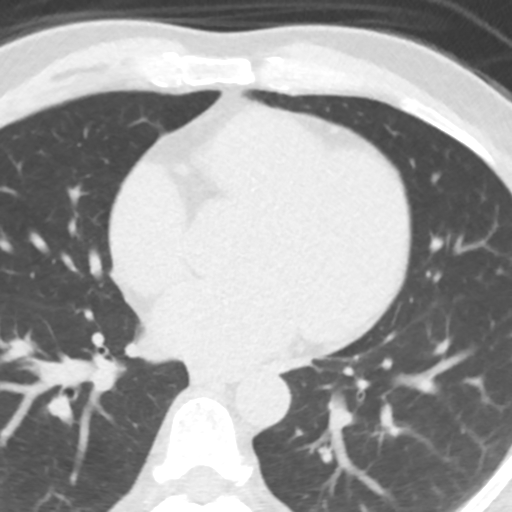
[im 30/45  vessel]
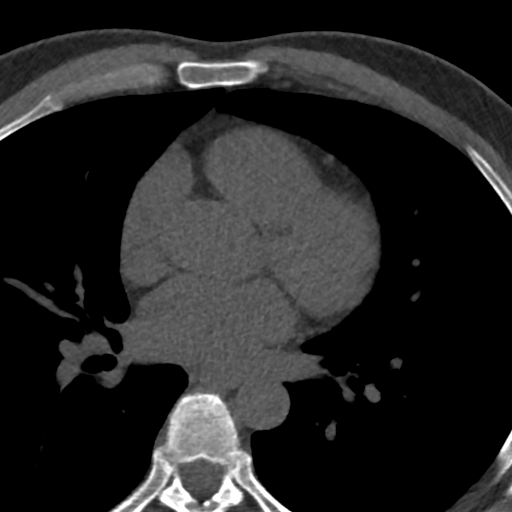
[im 35/45  vessel]
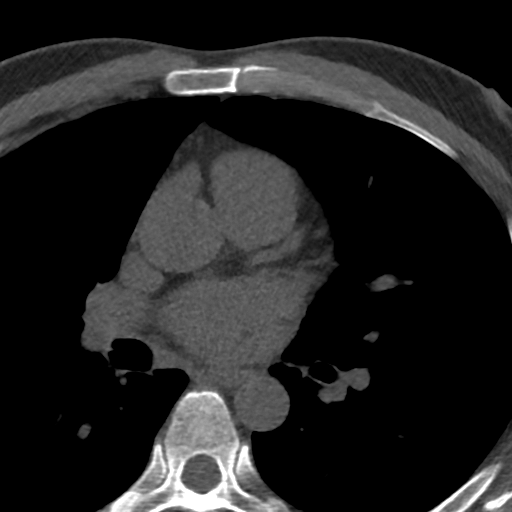
[im 40/45  vessel]
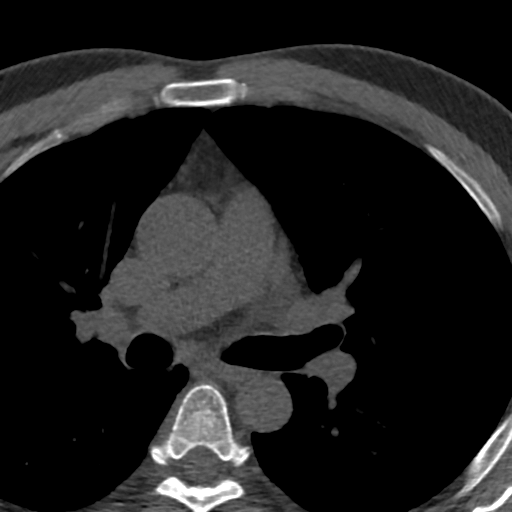

[Series 4: lung st 68 % · axial · 0.74mm/px · z∈[-242,-152]mm · 7 of 45 slices shown]
[im 5/45  lung]
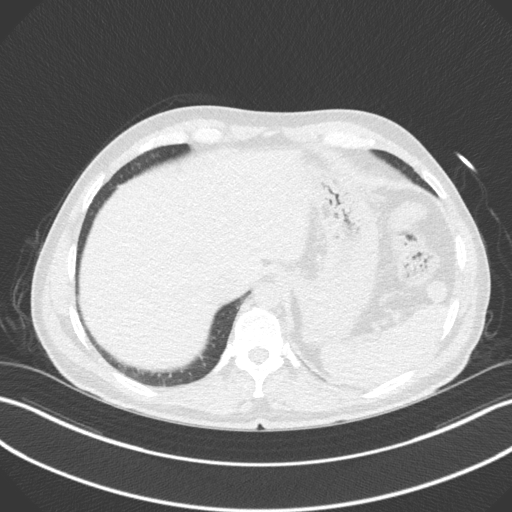
[im 10/45  lung]
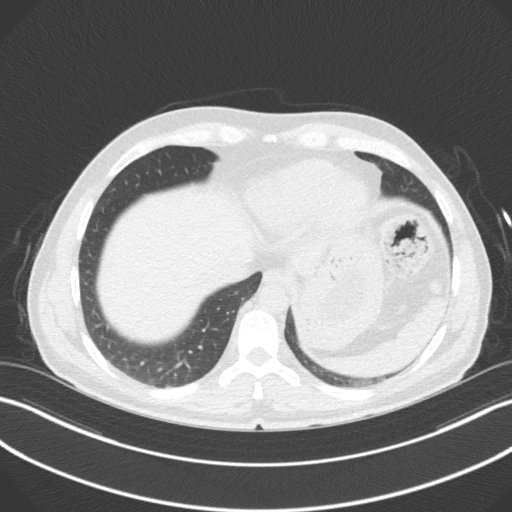
[im 15/45  lung]
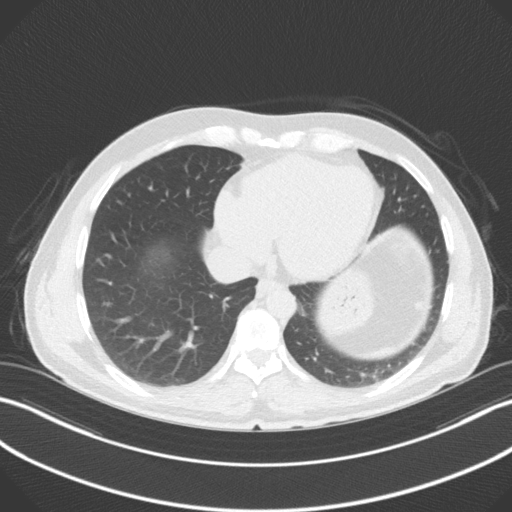
[im 20/45  lung]
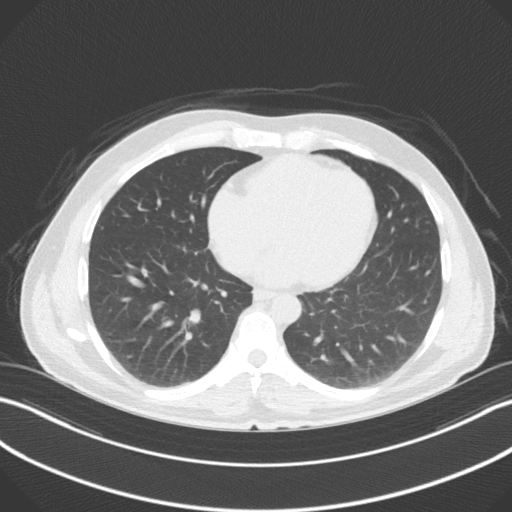
[im 25/45  lung]
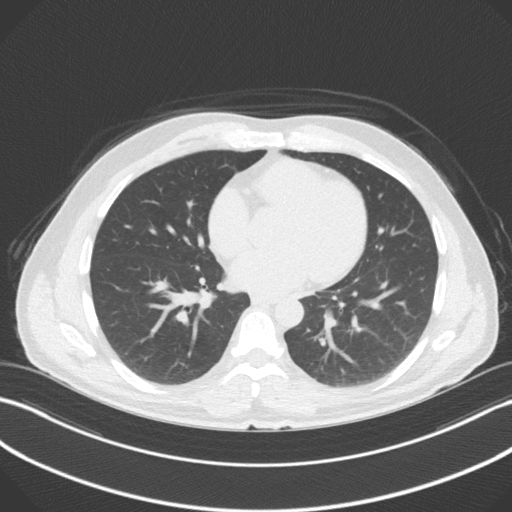
[im 30/45  lung]
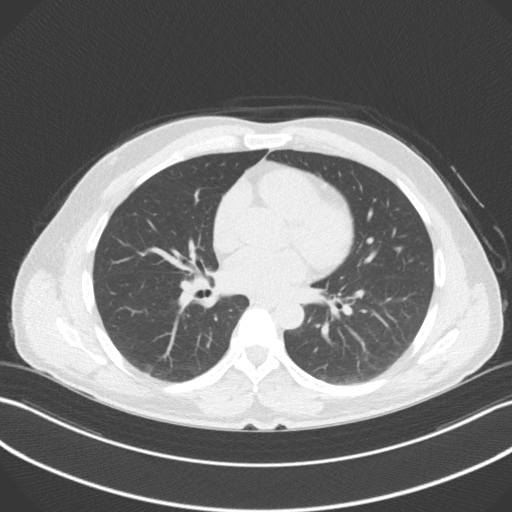
[im 35/45  lung]
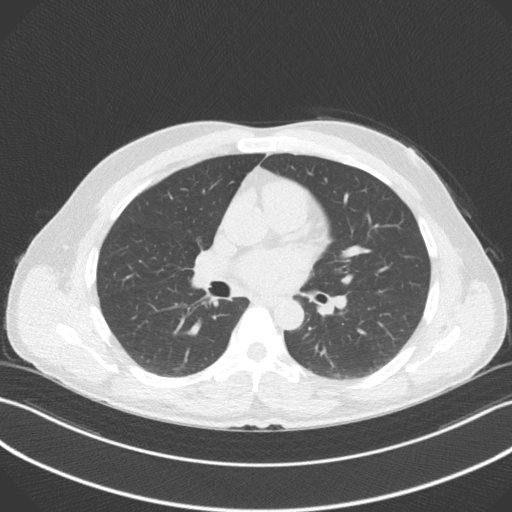

[15 of 20 positions shown; findings below may reference images not displayed]

FINDINGS: Vascular: Ascending thoracic aorta measures up to 3.2 cm. Descending
thoracic aorta measures 2.5 cm. Heart size is normal. No significant
pericardial fluid.

Mediastinum/Nodes: Visualized mediastinal structures are
unremarkable.

Lungs/Pleura: Visualized lungs are clear. No large pleural
effusions.

Upper Abdomen: Normal appearance of the visualized upper abdominal
structures.

Musculoskeletal: No acute abnormality.
IMPRESSION: No acute abnormality involving the extracardiac structures.
FINDINGS: Coronary arteries: Normal origins.

Coronary Calcium Score:

Left main: 0

Left anterior descending artery: 0

Left circumflex artery: 0

Right coronary artery: 0

Total: 0

Percentile:

Pericardium: Normal.

Ascending Aorta: Normal caliber.

Non-cardiac: See separate report from [REDACTED].
IMPRESSION: Coronary calcium score of 0.



If CAC=0, it is reasonable to withhold statin therapy and reassess
in 5 to 10 years, as long as higher risk conditions are absent
(diabetes mellitus, family history of premature CHD in first degree
relatives (males <55 years; females <65 years), cigarette smoking,
or LDL >=190 mg/dL).

If CAC is 1 to 99, it is reasonable to initiate statin therapy for
patients >=55 years of age.

If CAC is >=100 or >=75th percentile, it is reasonable to initiate
statin therapy at any age.

Cardiology referral should be considered for patients with CAC
scores >=400 or >=75th percentile.

*2362 AHA/ACC/AACVPR/AAPA/ABC/KUTZ/SHAMHA/MESIDOR/[place]/PATERS/SAW/RAMAYAHMAN
Guideline on the Management of Blood Cholesterol: A Report of the
American College of Cardiology/American Heart Association Task Force
on Clinical Practice Guidelines. J Am Coll Cardiol.
7436;73(24):6988-6386.

*** End of Addendum ***
EXAM:
OVER-READ INTERPRETATION  CT CHEST

The following report is an over-read performed by radiologist Dr.
Margie Henandez [REDACTED] on 04/14/2020. This over-read
does not include interpretation of cardiac or coronary anatomy or
pathology. The coronary calcium score interpretation by the
cardiologist is attached.
FINDINGS: Vascular: Ascending thoracic aorta measures up to 3.2 cm. Descending
thoracic aorta measures 2.5 cm. Heart size is normal. No significant
pericardial fluid.

Mediastinum/Nodes: Visualized mediastinal structures are
unremarkable.

Lungs/Pleura: Visualized lungs are clear. No large pleural
effusions.

Upper Abdomen: Normal appearance of the visualized upper abdominal
structures.

Musculoskeletal: No acute abnormality.
IMPRESSION: No acute abnormality involving the extracardiac structures.

## 2021-07-06 ENCOUNTER — Ambulatory Visit (INDEPENDENT_AMBULATORY_CARE_PROVIDER_SITE_OTHER): Payer: 59 | Admitting: Medical

## 2021-07-06 VITALS — BP 133/84 | HR 69 | Temp 97.4°F | Ht 66.0 in | Wt 145.0 lb

## 2021-07-06 DIAGNOSIS — E785 Hyperlipidemia, unspecified: Secondary | ICD-10-CM | POA: Diagnosis not present

## 2021-07-06 DIAGNOSIS — Z833 Family history of diabetes mellitus: Secondary | ICD-10-CM

## 2021-07-06 DIAGNOSIS — R739 Hyperglycemia, unspecified: Secondary | ICD-10-CM | POA: Diagnosis not present

## 2021-07-06 DIAGNOSIS — Z0000 Encounter for general adult medical examination without abnormal findings: Secondary | ICD-10-CM | POA: Diagnosis not present

## 2021-07-06 DIAGNOSIS — E559 Vitamin D deficiency, unspecified: Secondary | ICD-10-CM | POA: Diagnosis not present

## 2021-07-06 DIAGNOSIS — Z125 Encounter for screening for malignant neoplasm of prostate: Secondary | ICD-10-CM | POA: Diagnosis not present

## 2021-07-06 DIAGNOSIS — Z23 Encounter for immunization: Secondary | ICD-10-CM | POA: Diagnosis not present

## 2021-07-06 NOTE — Progress Notes (Signed)
Subjective:    Patient ID: Johnny Riggs, male    DOB: 09/24/1975, 46 y.o.   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    Pt states his ldl has been mildly high in past. He would like to get that studies done    Pt also has history of low vitamin D. Pt takes otc vit D gummy a day.    No other PMH other than cholesterol and low vitamin D history.  He did start crestor in past and only took for 2 months.    Review of Systems  Constitutional:  Negative for chills, fatigue and fever.  Respiratory:  Negative for cough, chest tightness, shortness of breath and wheezing.   Cardiovascular:  Negative for chest pain and palpitations.  Gastrointestinal:  Negative for abdominal pain and constipation.  Genitourinary:  Negative for dysuria, flank pain and frequency.  Musculoskeletal:  Negative for back pain, joint swelling, myalgias and neck stiffness.  Skin:  Negative for rash.  Neurological:  Negative for dizziness, light-headedness, numbness and headaches.  Hematological:  Negative for adenopathy. Does not bruise/bleed easily.  Psychiatric/Behavioral:  Negative for agitation, behavioral problems, confusion and dysphoric mood.     Past Medical History:  Diagnosis Date   Anal fissure    25 years ago    GERD (gastroesophageal reflux disease)    Hyperlipidemia    Low vitamin D level      Social History   Socioeconomic History   Marital status: Married    Spouse name: Not on file   Number of children: 2   Years of education: Not on file   Highest education level: Not on file  Occupational History   Occupation: hospitalist  Tobacco Use   Smoking status: Never   Smokeless tobacco: Never  Vaping Use   Vaping Use: Never used  Substance and Sexual Activity   Alcohol use: Not Currently    Comment: occas   Drug use: Never   Sexual activity: Yes  Other Topics Concern   Not on file  Social  History Narrative   Not on file   Social Determinants of Health   Financial Resource Strain: Not on file  Food Insecurity: Not on file  Transportation Needs: Not on file  Physical Activity: Not on file  Stress: Not on file  Social Connections: Not on file  Intimate Partner Violence: Not on file    Past Surgical History:  Procedure Laterality Date   CHOLECYSTECTOMY N/A 03/25/2019   Procedure: LAPAROSCOPIC CHOLECYSTECTOMY;  Surgeon: Rolm Bookbinder, MD;  Location: [place];  Service: General;  Laterality: N/A;   CYST EXCISION N/A 03/11/2018   Procedure: EXCISION OF PERINEAL CYST;  Surgeon: Clovis Riley, MD;  Location: [place];  Service: General;  Laterality: N/A;   ESOPHAGOGASTRODUODENOSCOPY     over 25 years ago. In [place]   NO PAST SURGERIES      Family History  Problem Relation Age of Onset   Diabetes Mother    Irritable bowel syndrome Mother    Colon polyps Father    Esophageal cancer Neg Hx    Colon cancer Neg Hx     No Known Allergies  Current Outpatient Medications on File Prior to Visit  Medication Sig Dispense Refill   Cholecalciferol (VITAMIN D) 50 MCG (2000 UT) tablet Take 2,000 Units by mouth daily.     oxyCODONE (OXY IR/ROXICODONE) 5  MG immediate release tablet Take 1 tablet (5 mg total) by mouth every 6 (six) hours as needed for moderate pain, severe pain or breakthrough pain. 10 tablet 0   No current facility-administered medications on file prior to visit.    BP 133/84   Pulse 69   Temp (!) 97.4 F (36.3 C)   Ht '5\' 6"'$  (1.676 m)   Wt 145 lb (65.8 kg)   SpO2 99%   BMI 23.40 kg/m        Objective:   Physical Exam   General Mental Status- Alert. General Appearance- Not in acute distress.   Skin General: Color- Normal Color. Moisture- Normal Moisture.  Neck Carotid Arteries- Normal color. Moisture- Normal Moisture. No carotid bruits. No JVD.  Chest and Lung Exam Auscultation: Breath  Sounds:-Normal.  Cardiovascular Auscultation:Rythm- Regular. Murmurs & Other Heart Sounds:Auscultation of the heart reveals- No Murmurs.  Abdomen Inspection:-Inspeection Normal. Palpation/Percussion:Note:No mass. Palpation and Percussion of the abdomen reveal- Non Tender, Non Distended + BS, no rebound or guarding.   Neurologic Cranial Nerve exam:- CN III-XII intact(No nystagmus), symmetric smile. Strength:- 5/5 equal and symmetric strength both upper and lower extremities.      Assessment & Plan:  For you wellness exam today I have ordered cbc, cmp and lipid panel.  For vit d deficiency placed vit D level.  Vaccine given today. Tdap.  Recommend exercise and healthy diet.  We will let you know lab results as they come in.  Follow up date appointment will be determined after lab review.      Mackie Pai, PA-C

## 2021-07-06 NOTE — Addendum Note (Signed)
Addended by: Lynnea Ferrier R on: 07/06/2021 03:51 PM   Modules accepted: Orders

## 2021-07-06 NOTE — Patient Instructions (Addendum)
For you wellness exam today I have ordered cbc, cmp and lipid panel.  For vit d deficiency placed vit D level.  Vaccine given today. Tdap.  Recommend exercise and healthy diet.  We will let you know lab results as they come in.  Follow up date appointment will be determined after lab review.      Preventive Care 2-48 Years Old, Male Preventive care refers to lifestyle choices and visits with your health care provider that can promote health and wellness. Preventive care visits are also called wellness exams. What can I expect for my preventive care visit? Counseling During your preventive care visit, your health care provider may ask about your: Medical history, including: Past medical problems. Family medical history. Current health, including: Emotional well-being. Home life and relationship well-being. Sexual activity. Lifestyle, including: Alcohol, nicotine or tobacco, and drug use. Access to firearms. Diet, exercise, and sleep habits. Safety issues such as seatbelt and bike helmet use. Sunscreen use. Work and work Statistician. Physical exam Your health care provider will check your: Height and weight. These may be used to calculate your BMI (body mass index). BMI is a measurement that tells if you are at a healthy weight. Waist circumference. This measures the distance around your waistline. This measurement also tells if you are at a healthy weight and may help predict your risk of certain diseases, such as type 2 diabetes and high blood pressure. Heart rate and blood pressure. Body temperature. Skin for abnormal spots. What immunizations do I need?  Vaccines are usually given at various ages, according to a schedule. Your health care provider will recommend vaccines for you based on your age, medical history, and lifestyle or other factors, such as travel or where you work. What tests do I need? Screening Your health care provider may recommend screening tests for  certain conditions. This may include: Lipid and cholesterol levels. Diabetes screening. This is done by checking your blood sugar (glucose) after you have not eaten for a while (fasting). Hepatitis B test. Hepatitis C test. HIV (human immunodeficiency virus) test. STI (sexually transmitted infection) testing, if you are at risk. Lung cancer screening. Prostate cancer screening. Colorectal cancer screening. Talk with your health care provider about your test results, treatment options, and if necessary, the need for more tests. Follow these instructions at home: Eating and drinking  Eat a diet that includes fresh fruits and vegetables, whole grains, lean protein, and low-fat dairy products. Take vitamin and mineral supplements as recommended by your health care provider. Do not drink alcohol if your health care provider tells you not to drink. If you drink alcohol: Limit how much you have to 0-2 drinks a day. Know how much alcohol is in your drink. In the [place] one drink equals one 12 oz bottle of beer (355 mL), one 5 oz glass of wine (148 mL), or one 1 oz glass of hard liquor (44 mL). Lifestyle Brush your teeth every morning and night with fluoride toothpaste. Floss one time each day. Exercise for at least 30 minutes 5 or more days each week. Do not use any products that contain nicotine or tobacco. These products include cigarettes, chewing tobacco, and vaping devices, such as e-cigarettes. If you need help quitting, ask your health care provider. Do not use drugs. If you are sexually active, practice safe sex. Use a condom or other form of protection to prevent STIs. Take aspirin only as told by your health care provider. Make sure that you understand how much  to take and what form to take. Work with your health care provider to find out whether it is safe and beneficial for you to take aspirin daily. Find healthy ways to manage stress, such as: Meditation, yoga, or listening to  music. Journaling. Talking to a trusted person. Spending time with friends and family. Minimize exposure to UV radiation to reduce your risk of skin cancer. Safety Always wear your seat belt while driving or riding in a vehicle. Do not drive: If you have been drinking alcohol. Do not ride with someone who has been drinking. When you are tired or distracted. While texting. If you have been using any mind-altering substances or drugs. Wear a helmet and other protective equipment during sports activities. If you have firearms in your house, make sure you follow all gun safety procedures. What's next? Go to your health care provider once a year for an annual wellness visit. Ask your health care provider how often you should have your eyes and teeth checked. Stay up to date on all vaccines. This information is not intended to replace advice given to you by your health care provider. Make sure you discuss any questions you have with your health care provider. Document Revised: 06/23/2020 Document Reviewed: 06/23/2020 Elsevier Patient Education  [employer].

## 2021-07-11 ENCOUNTER — Encounter: Payer: Self-pay | Admitting: Medical

## 2021-07-11 ENCOUNTER — Other Ambulatory Visit (INDEPENDENT_AMBULATORY_CARE_PROVIDER_SITE_OTHER): Payer: 59

## 2021-07-11 DIAGNOSIS — Z0000 Encounter for general adult medical examination without abnormal findings: Secondary | ICD-10-CM | POA: Diagnosis not present

## 2021-07-11 DIAGNOSIS — Z125 Encounter for screening for malignant neoplasm of prostate: Secondary | ICD-10-CM

## 2021-07-11 DIAGNOSIS — Z833 Family history of diabetes mellitus: Secondary | ICD-10-CM | POA: Diagnosis not present

## 2021-07-11 DIAGNOSIS — E559 Vitamin D deficiency, unspecified: Secondary | ICD-10-CM | POA: Diagnosis not present

## 2021-07-11 DIAGNOSIS — R739 Hyperglycemia, unspecified: Secondary | ICD-10-CM | POA: Diagnosis not present

## 2021-07-11 LAB — CBC WITH DIFFERENTIAL/PLATELET
Basophils Absolute: 0 10*3/uL (ref 0.0–0.1)
Basophils Relative: 0.6 % (ref 0.0–3.0)
Eosinophils Absolute: 0.2 10*3/uL (ref 0.0–0.7)
Eosinophils Relative: 2.1 % (ref 0.0–5.0)
HCT: 44.1 % (ref 39.0–52.0)
Hemoglobin: 14.8 g/dL (ref 13.0–17.0)
Lymphocytes Relative: 20.1 % (ref 12.0–46.0)
Lymphs Abs: 1.6 10*3/uL (ref 0.7–4.0)
MCHC: 33.5 g/dL (ref 30.0–36.0)
MCV: 88.3 fl (ref 78.0–100.0)
Monocytes Absolute: 0.6 10*3/uL (ref 0.1–1.0)
Monocytes Relative: 7.3 % (ref 3.0–12.0)
Neutro Abs: 5.6 10*3/uL (ref 1.4–7.7)
Neutrophils Relative %: 69.9 % (ref 43.0–77.0)
Platelets: 262 10*3/uL (ref 150.0–400.0)
RBC: 4.99 Mil/uL (ref 4.22–5.81)
RDW: 12.6 % (ref 11.5–15.5)
WBC: 8 10*3/uL (ref 4.0–10.5)

## 2021-07-11 LAB — COMPREHENSIVE METABOLIC PANEL
ALT: 13 U/L (ref 0–53)
AST: 14 U/L (ref 0–37)
Albumin: 4.2 g/dL (ref 3.5–5.2)
Alkaline Phosphatase: 76 U/L (ref 39–117)
BUN: 10 mg/dL (ref 6–23)
CO2: 28 mEq/L (ref 19–32)
Calcium: 9.3 mg/dL (ref 8.4–10.5)
Chloride: 104 mEq/L (ref 96–112)
Creatinine, Ser: 0.99 mg/dL (ref 0.40–1.50)
GFR: 90.53 mL/min (ref >60.00)
Glucose, Bld: 88 mg/dL (ref 70–99)
Potassium: 4.3 mEq/L (ref 3.5–5.1)
Sodium: 139 mEq/L (ref 135–145)
Total Bilirubin: 0.6 mg/dL (ref 0.2–1.2)
Total Protein: 7.1 g/dL (ref 6.0–8.3)

## 2021-07-11 LAB — LIPID PANEL
Cholesterol: 169 mg/dL (ref 0–200)
HDL: 31.1 mg/dL — ABNORMAL LOW (ref >39.00)
LDL Cholesterol: 106 mg/dL — ABNORMAL HIGH (ref 0–99)
NonHDL: 137.62
Total CHOL/HDL Ratio: 5
Triglycerides: 160 mg/dL — ABNORMAL HIGH (ref 0.0–149.0)
VLDL: 32 mg/dL (ref 0.0–40.0)

## 2021-07-11 LAB — HEMOGLOBIN A1C: Hgb A1c MFr Bld: 5.8 % (ref 4.6–6.5)

## 2021-07-11 LAB — VITAMIN D 25 HYDROXY (VIT D DEFICIENCY, FRACTURES): VITD: 32.59 ng/mL (ref 30.00–100.00)

## 2021-07-11 LAB — PSA: PSA: 1 ng/mL (ref 0.10–4.00)

## 2021-09-29 DIAGNOSIS — H524 Presbyopia: Secondary | ICD-10-CM | POA: Diagnosis not present

## 2021-09-29 DIAGNOSIS — H5213 Myopia, bilateral: Secondary | ICD-10-CM | POA: Diagnosis not present

## 2021-09-29 DIAGNOSIS — H52223 Regular astigmatism, bilateral: Secondary | ICD-10-CM | POA: Diagnosis not present

## 2022-09-04 ENCOUNTER — Telehealth: Payer: Self-pay

## 2022-09-04 DIAGNOSIS — Z125 Encounter for screening for malignant neoplasm of prostate: Secondary | ICD-10-CM

## 2022-09-04 DIAGNOSIS — R739 Hyperglycemia, unspecified: Secondary | ICD-10-CM

## 2022-09-04 DIAGNOSIS — Z0000 Encounter for general adult medical examination without abnormal findings: Secondary | ICD-10-CM

## 2022-09-04 DIAGNOSIS — E559 Vitamin D deficiency, unspecified: Secondary | ICD-10-CM

## 2022-09-04 NOTE — Telephone Encounter (Signed)
Pt wants to know if they can get labs before CPE on 09/14/22

## 2022-09-04 NOTE — Telephone Encounter (Signed)
Future labs placed. Recommend getting labs on Friday or Monday.

## 2022-09-04 NOTE — Addendum Note (Signed)
Addended by: Gwenevere Abbot on: 09/04/2022 09:03 PM   Modules accepted: Orders

## 2022-09-04 NOTE — Telephone Encounter (Signed)
Sent PCP phone note in regards to lab

## 2022-09-05 NOTE — Telephone Encounter (Signed)
Pt lab scheduled

## 2022-09-13 ENCOUNTER — Other Ambulatory Visit: Payer: Commercial Managed Care - PPO

## 2022-09-14 ENCOUNTER — Ambulatory Visit (INDEPENDENT_AMBULATORY_CARE_PROVIDER_SITE_OTHER): Payer: 59 | Admitting: Medical

## 2022-09-14 ENCOUNTER — Other Ambulatory Visit: Payer: 59

## 2022-09-14 VITALS — BP 126/78 | HR 62 | Temp 97.9°F | Resp 18 | Ht 66.0 in | Wt 147.0 lb

## 2022-09-14 DIAGNOSIS — Z1211 Encounter for screening for malignant neoplasm of colon: Secondary | ICD-10-CM | POA: Diagnosis not present

## 2022-09-14 DIAGNOSIS — Z125 Encounter for screening for malignant neoplasm of prostate: Secondary | ICD-10-CM | POA: Diagnosis not present

## 2022-09-14 DIAGNOSIS — Z0000 Encounter for general adult medical examination without abnormal findings: Secondary | ICD-10-CM

## 2022-09-14 DIAGNOSIS — E559 Vitamin D deficiency, unspecified: Secondary | ICD-10-CM

## 2022-09-14 DIAGNOSIS — R739 Hyperglycemia, unspecified: Secondary | ICD-10-CM | POA: Diagnosis not present

## 2022-09-14 LAB — COMPREHENSIVE METABOLIC PANEL
ALT: 23 U/L (ref 0–53)
AST: 22 U/L (ref 0–37)
Albumin: 3.9 g/dL (ref 3.5–5.2)
Alkaline Phosphatase: 84 U/L (ref 39–117)
BUN: 19 mg/dL (ref 6–23)
CO2: 30 meq/L (ref 19–32)
Calcium: 9.1 mg/dL (ref 8.4–10.5)
Chloride: 103 meq/L (ref 96–112)
Creatinine, Ser: 0.93 mg/dL (ref 0.40–1.50)
GFR: 96.78 mL/min (ref >60.00)
Glucose, Bld: 87 mg/dL (ref 70–99)
Potassium: 4.8 meq/L (ref 3.5–5.1)
Sodium: 138 meq/L (ref 135–145)
Total Bilirubin: 0.9 mg/dL (ref 0.2–1.2)
Total Protein: 6.6 g/dL (ref 6.0–8.3)

## 2022-09-14 LAB — CBC WITH DIFFERENTIAL/PLATELET
Basophils Absolute: 0 10*3/uL (ref 0.0–0.1)
Basophils Relative: 0.6 % (ref 0.0–3.0)
Eosinophils Absolute: 0.1 10*3/uL (ref 0.0–0.7)
Eosinophils Relative: 0.9 % (ref 0.0–5.0)
HCT: 44.7 % (ref 39.0–52.0)
Hemoglobin: 14.5 g/dL (ref 13.0–17.0)
Lymphocytes Relative: 28.1 % (ref 12.0–46.0)
Lymphs Abs: 1.7 10*3/uL (ref 0.7–4.0)
MCHC: 32.4 g/dL (ref 30.0–36.0)
MCV: 90.4 fl (ref 78.0–100.0)
Monocytes Absolute: 0.4 10*3/uL (ref 0.1–1.0)
Monocytes Relative: 7.2 % (ref 3.0–12.0)
Neutro Abs: 3.9 10*3/uL (ref 1.4–7.7)
Neutrophils Relative %: 63.2 % (ref 43.0–77.0)
Platelets: 289 10*3/uL (ref 150.0–400.0)
RBC: 4.94 Mil/uL (ref 4.22–5.81)
RDW: 13.3 % (ref 11.5–15.5)
WBC: 6.1 10*3/uL (ref 4.0–10.5)

## 2022-09-14 LAB — HEMOGLOBIN A1C: Hgb A1c MFr Bld: 6 % (ref 4.6–6.5)

## 2022-09-14 LAB — LIPID PANEL
Cholesterol: 192 mg/dL (ref 0–200)
HDL: 40.4 mg/dL (ref >39.00)
LDL Cholesterol: 127 mg/dL — ABNORMAL HIGH (ref 0–99)
NonHDL: 151.18
Total CHOL/HDL Ratio: 5
Triglycerides: 119 mg/dL (ref 0.0–149.0)
VLDL: 23.8 mg/dL (ref 0.0–40.0)

## 2022-09-14 LAB — PSA: PSA: 0.95 ng/mL (ref 0.10–4.00)

## 2022-09-14 LAB — VITAMIN D 25 HYDROXY (VIT D DEFICIENCY, FRACTURES): VITD: 29.2 ng/mL — ABNORMAL LOW (ref 30.00–100.00)

## 2022-09-14 MED ORDER — VITAMIN D (ERGOCALCIFEROL) 1.25 MG (50000 UNIT) PO CAPS
50000.0000 [IU] | ORAL_CAPSULE | ORAL | 0 refills | Status: DC
  Filled 2022-09-14: qty 8, 56d supply, fill #0

## 2022-09-14 NOTE — Progress Notes (Signed)
Subjective:    Patient ID: Johnny Riggs, male    DOB: 10/02/1975, 47 y.o.   MRN: [id number]  HPI  Here for wellness exam and is fasting.  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    Ldl has been mildly high in past.    Pt also has history of low vitamin D. No vit d gummy for past 3 months.   No other PMH other than cholesterol and low vitamin D history.   Dad had hx of high cholesterol and mini strokes   In the past he did start crestor in past and only took for 2 months.  Pt cleared rt ear wax yesterday with debrox.     Review of Systems  Constitutional:  Negative for chills, fatigue and fever.  Respiratory:  Negative for cough, chest tightness, shortness of breath and wheezing.   Cardiovascular:  Negative for chest pain and palpitations.  Gastrointestinal:  Negative for abdominal pain and constipation.  Genitourinary:  Negative for dysuria, flank pain and frequency.  Musculoskeletal:  Negative for back pain, joint swelling, myalgias and neck stiffness.  Skin:  Negative for rash.  Neurological:  Negative for dizziness, light-headedness, numbness and headaches.  Hematological:  Negative for adenopathy. Does not bruise/bleed easily.  Psychiatric/Behavioral:  Negative for agitation, behavioral problems, confusion and dysphoric mood.      Past Medical History:  Diagnosis Date   Anal fissure    25 years ago    GERD (gastroesophageal reflux disease)    Hyperlipidemia    Low vitamin D level      Social History   Socioeconomic History   Marital status: Married    Spouse name: Not on file   Number of children: 2   Years of education: Not on file   Highest education level: Not on file  Occupational History   Occupation: hospitalist  Tobacco Use   Smoking status: Never   Smokeless tobacco: Never  Vaping Use   Vaping status: Never Used  Substance and Sexual Activity   Alcohol use: Not  Currently    Comment: occas   Drug use: Never   Sexual activity: Yes  Other Topics Concern   Not on file  Social History Narrative   Not on file   Social Determinants of Health   Financial Resource Strain: Not on file  Food Insecurity: Not on file  Transportation Needs: Not on file  Physical Activity: Not on file  Stress: Not on file  Social Connections: Not on file  Intimate Partner Violence: Not on file    Past Surgical History:  Procedure Laterality Date   CHOLECYSTECTOMY N/A 03/25/2019   Procedure: LAPAROSCOPIC CHOLECYSTECTOMY;  Surgeon: Emelia Loron, MD;  Location: [facility];  Service: General;  Laterality: N/A;   CYST EXCISION N/A 03/11/2018   Procedure: EXCISION OF PERINEAL CYST;  Surgeon: Berna Bue, MD;  Location: [facility];  Service: General;  Laterality: N/A;   ESOPHAGOGASTRODUODENOSCOPY     over 25 years ago. In [place]   NO PAST SURGERIES      Family History  Problem Relation Age of Onset   Diabetes Mother    Irritable bowel syndrome Mother    Colon polyps Father    Esophageal cancer Neg Hx    Colon cancer Neg Hx     No Known Allergies  No current outpatient medications on file prior to visit.   No current  facility-administered medications on file prior to visit.    BP 126/78   Pulse 62   Temp 97.9 F (36.6 C)   Resp 18   Ht 5\' 6"  (1.676 m)   Wt 147 lb (66.7 kg)   SpO2 100%   BMI 23.73 kg/m        Objective:   Physical Exam  General Mental Status- Alert. General Appearance- Not in acute distress.    Skin General: Color- Normal Color. Moisture- Normal Moisture.   Neck Carotid Arteries- Normal color. Moisture- Normal Moisture. No carotid bruits. No JVD.   Chest and Lung Exam Auscultation: Breath Sounds:-Normal.   Cardiovascular Auscultation:Rythm- Regular. Murmurs & Other Heart Sounds:Auscultation of the heart reveals- No Murmurs.   Abdomen Inspection:-Inspeection  Normal. Palpation/Percussion:Note:No mass. Palpation and Percussion of the abdomen reveal- Non Tender, Non Distended + BS, no rebound or guarding.     Neurologic Cranial Nerve exam:- CN III-XII intact(No nystagmus), symmetric smile. Strength:- 5/5 equal and symmetric strength both upper and lower extremities.   Heent- no sinus pressure canals clear and normal tm's bilaterally.       Assessment & Plan:     Patient Instructions  For you wellness exam today I have ordered cbc, psa, cmp and lipid panel.   For vit d deficiency placed vit D level.   Vaccine up to date. Flu vaccine thru work.  Screening colonoscopy up to date. Checked in epic.   Recommend exercise and healthy diet.   We will let you know lab results as they come in.   Follow up date appointment will be determined after lab review.     Esperanza Richters, PA-C  Esperanza Richters, PA-C

## 2022-09-14 NOTE — Patient Instructions (Addendum)
For you wellness exam today I have ordered cbc, psa, cmp and lipid panel.   For vit d deficiency placed vit D level.   Vaccine up to date. Flu vaccine thru work.  Screening colonoscopy up to date. Checked in epic.   Recommend exercise and healthy diet.   We will let you know lab results as they come in.   Follow up date appointment will be determined after lab review.    Preventive Care 31-49 Years Old, Male Preventive care refers to lifestyle choices and visits with your health care provider that can promote health and wellness. Preventive care visits are also called wellness exams. What can I expect for my preventive care visit? Counseling During your preventive care visit, your health care provider may ask about your: Medical history, including: Past medical problems. Family medical history. Current health, including: Emotional well-being. Home life and relationship well-being. Sexual activity. Lifestyle, including: Alcohol, nicotine or tobacco, and drug use. Access to firearms. Diet, exercise, and sleep habits. Safety issues such as seatbelt and bike helmet use. Sunscreen use. Work and work Astronomer. Physical exam Your health care provider will check your: Height and weight. These may be used to calculate your BMI (body mass index). BMI is a measurement that tells if you are at a healthy weight. Waist circumference. This measures the distance around your waistline. This measurement also tells if you are at a healthy weight and may help predict your risk of certain diseases, such as type 2 diabetes and high blood pressure. Heart rate and blood pressure. Body temperature. Skin for abnormal spots. What immunizations do I need?  Vaccines are usually given at various ages, according to a schedule. Your health care provider will recommend vaccines for you based on your age, medical history, and lifestyle or other factors, such as travel or where you work. What tests do I  need? Screening Your health care provider may recommend screening tests for certain conditions. This may include: Lipid and cholesterol levels. Diabetes screening. This is done by checking your blood sugar (glucose) after you have not eaten for a while (fasting). Hepatitis B test. Hepatitis C test. HIV (human immunodeficiency virus) test. STI (sexually transmitted infection) testing, if you are at risk. Lung cancer screening. Prostate cancer screening. Colorectal cancer screening. Talk with your health care provider about your test results, treatment options, and if necessary, the need for more tests. Follow these instructions at home: Eating and drinking  Eat a diet that includes fresh fruits and vegetables, whole grains, lean protein, and low-fat dairy products. Take vitamin and mineral supplements as recommended by your health care provider. Do not drink alcohol if your health care provider tells you not to drink. If you drink alcohol: Limit how much you have to 0-2 drinks a day. Know how much alcohol is in your drink. In the [place] one drink equals one 12 oz bottle of beer (355 mL), one 5 oz glass of wine (148 mL), or one 1 oz glass of hard liquor (44 mL). Lifestyle Brush your teeth every morning and night with fluoride toothpaste. Floss one time each day. Exercise for at least 30 minutes 5 or more days each week. Do not use any products that contain nicotine or tobacco. These products include cigarettes, chewing tobacco, and vaping devices, such as e-cigarettes. If you need help quitting, ask your health care provider. Do not use drugs. If you are sexually active, practice safe sex. Use a condom or other form of protection to prevent STIs.  Take aspirin only as told by your health care provider. Make sure that you understand how much to take and what form to take. Work with your health care provider to find out whether it is safe and beneficial for you to take aspirin daily. Find  healthy ways to manage stress, such as: Meditation, yoga, or listening to music. Journaling. Talking to a trusted person. Spending time with friends and family. Minimize exposure to UV radiation to reduce your risk of skin cancer. Safety Always wear your seat belt while driving or riding in a vehicle. Do not drive: If you have been drinking alcohol. Do not ride with someone who has been drinking. When you are tired or distracted. While texting. If you have been using any mind-altering substances or drugs. Wear a helmet and other protective equipment during sports activities. If you have firearms in your house, make sure you follow all gun safety procedures. What's next? Go to your health care provider once a year for an annual wellness visit. Ask your health care provider how often you should have your eyes and teeth checked. Stay up to date on all vaccines. This information is not intended to replace advice given to you by your health care provider. Make sure you discuss any questions you have with your health care provider. Document Revised: 06/23/2020 Document Reviewed: 06/23/2020 Elsevier Patient Education  2024 [employer].

## 2022-09-14 NOTE — Addendum Note (Signed)
Addended by: Gwenevere Abbot on: 09/14/2022 09:18 PM   Modules accepted: Orders

## 2022-09-15 ENCOUNTER — Other Ambulatory Visit (HOSPITAL_COMMUNITY): Payer: Self-pay

## 2022-09-18 ENCOUNTER — Other Ambulatory Visit (HOSPITAL_COMMUNITY): Payer: Self-pay

## 2022-11-20 ENCOUNTER — Other Ambulatory Visit (HOSPITAL_COMMUNITY): Payer: Self-pay

## 2022-11-22 ENCOUNTER — Encounter: Payer: Self-pay | Admitting: Gastroenterology

## 2023-08-30 ENCOUNTER — Encounter: Payer: Self-pay | Admitting: Gastroenterology

## 2023-10-04 ENCOUNTER — Other Ambulatory Visit (HOSPITAL_COMMUNITY): Payer: Self-pay

## 2023-10-04 ENCOUNTER — Encounter: Payer: Self-pay | Admitting: Gastroenterology

## 2023-10-04 ENCOUNTER — Ambulatory Visit (AMBULATORY_SURGERY_CENTER)

## 2023-10-04 VITALS — Ht 66.0 in | Wt 147.0 lb

## 2023-10-04 DIAGNOSIS — Z860100 Personal history of colon polyps, unspecified: Secondary | ICD-10-CM

## 2023-10-04 MED ORDER — NA SULFATE-K SULFATE-MG SULF 17.5-3.13-1.6 GM/177ML PO SOLN
1.0000 | Freq: Once | ORAL | 0 refills | Status: AC
Start: 2023-10-04 — End: 2023-10-23
  Filled 2023-10-04 – 2023-10-22 (×2): qty 354, 1d supply, fill #0

## 2023-10-04 NOTE — Progress Notes (Signed)

## 2023-10-15 ENCOUNTER — Other Ambulatory Visit (HOSPITAL_COMMUNITY): Payer: Self-pay

## 2023-10-22 ENCOUNTER — Other Ambulatory Visit (HOSPITAL_COMMUNITY): Payer: Self-pay

## 2023-10-25 ENCOUNTER — Encounter: Admitting: Gastroenterology

## 2023-10-26 ENCOUNTER — Ambulatory Visit (AMBULATORY_SURGERY_CENTER): Admitting: Gastroenterology

## 2023-10-26 ENCOUNTER — Encounter: Payer: Self-pay | Admitting: Gastroenterology

## 2023-10-26 VITALS — BP 131/91 | HR 66 | Temp 97.4°F | Resp 16 | Ht 66.0 in | Wt 147.0 lb

## 2023-10-26 DIAGNOSIS — E785 Hyperlipidemia, unspecified: Secondary | ICD-10-CM | POA: Diagnosis not present

## 2023-10-26 DIAGNOSIS — Z860101 Personal history of adenomatous and serrated colon polyps: Secondary | ICD-10-CM | POA: Diagnosis not present

## 2023-10-26 DIAGNOSIS — Z1211 Encounter for screening for malignant neoplasm of colon: Secondary | ICD-10-CM

## 2023-10-26 DIAGNOSIS — Z83719 Family history of colon polyps, unspecified: Secondary | ICD-10-CM

## 2023-10-26 DIAGNOSIS — K640 First degree hemorrhoids: Secondary | ICD-10-CM

## 2023-10-26 DIAGNOSIS — Z860100 Personal history of colon polyps, unspecified: Secondary | ICD-10-CM

## 2023-10-26 MED ORDER — SODIUM CHLORIDE 0.9 % IV SOLN: 500.0000 mL | INTRAVENOUS | Status: DC

## 2023-10-26 NOTE — Progress Notes (Signed)
 A/o x 3, VSS, good SR's, pleased with anesthesia, report to RN

## 2023-10-26 NOTE — Progress Notes (Signed)
 Pt's states no medical or surgical changes since previsit or office visit.

## 2023-10-26 NOTE — Progress Notes (Signed)
 [facility] History and Physical   Primary Care Physician:  Dorina Loving, PA-C   Reason for Procedure:   H/O polyps 2021, FH polyps (dad <60)  Plan:    colon     HPI: Johnny Riggs is a 48 y.o. male    Past Medical History:  Diagnosis Date   Anal fissure    25 years ago    GERD (gastroesophageal reflux disease)    Hyperlipidemia    Low vitamin D  level     Past Surgical History:  Procedure Laterality Date   CHOLECYSTECTOMY N/A 03/25/2019   Procedure: LAPAROSCOPIC CHOLECYSTECTOMY;  Surgeon: Ebbie Cough, MD;  Location: [facility];  Service: General;  Laterality: N/A;   CYST EXCISION N/A 03/11/2018   Procedure: EXCISION OF PERINEAL CYST;  Surgeon: Signe Mitzie LABOR, MD;  Location:  SURGERY CENTER;  Service: General;  Laterality: N/A;   ESOPHAGOGASTRODUODENOSCOPY     over 25 years ago. In [place]   NO PAST SURGERIES      Prior to Admission medications   Medication Sig Start Date End Date Taking? Authorizing Provider  Cholecalciferol 50 MCG (2000 UT) CAPS 1 capsule. Patient not taking: Reported on 10/04/2023    [provider]  Vitamin D , Ergocalciferol , (DRISDOL ) 1.25 MG (50000 UNIT) CAPS capsule Take 1 capsule (50,000 Units total) by mouth every 7 (seven) days. 09/14/22   Saguier, Loving, PA-C    Current Outpatient Medications  Medication Sig Dispense Refill   Cholecalciferol 50 MCG (2000 UT) CAPS 1 capsule. (Patient not taking: Reported on 10/04/2023)     Vitamin D , Ergocalciferol , (DRISDOL ) 1.25 MG (50000 UNIT) CAPS capsule Take 1 capsule (50,000 Units total) by mouth every 7 (seven) days. 8 capsule 0   Current Facility-Administered Medications  Medication Dose Route Frequency Provider Last Rate Last Admin   0.9 %  sodium chloride  infusion  500 mL Intravenous Continuous Charlanne Groom, MD        Allergies as of 10/26/2023   (No Known Allergies)    Family History  Problem Relation Age of Onset   Diabetes Mother     Irritable bowel syndrome Mother    Colon polyps Father    Esophageal cancer Neg Hx    Colon cancer Neg Hx    Stomach cancer Neg Hx    Rectal cancer Neg Hx     Social History   Socioeconomic History   Marital status: Married    Spouse name: Not on file   Number of children: 2   Years of education: Not on file   Highest education level: Not on file  Occupational History   Occupation: hospitalist  Tobacco Use   Smoking status: Never   Smokeless tobacco: Never  Vaping Use   Vaping status: Never Used  Substance and Sexual Activity   Alcohol use: Not Currently    Comment: occas   Drug use: Never   Sexual activity: Yes  Other Topics Concern   Not on file  Social History Narrative   Not on file   Social Drivers of Health   Financial Resource Strain: Not on file  Food Insecurity: Not on file  Transportation Needs: Not on file  Physical Activity: Not on file  Stress: Not on file  Social Connections: Not on file  Intimate Partner Violence: Not on file    Review of Systems: Positive for none All other review of systems negative except as mentioned in the HPI.  Physical Exam: Vital signs in last 24 hours: @VSRANGES @  General:   Alert,  Well-developed, well-nourished, pleasant and cooperative in NAD Lungs:  Clear throughout to auscultation.   Heart:  Regular rate and rhythm; no murmurs, clicks, rubs,  or gallops. Abdomen:  Soft, nontender and nondistended. Normal bowel sounds.   Neuro/Psych:  Alert and cooperative. Normal mood and affect. A and O x 3    No significant changes were identified.  The patient continues to be an appropriate candidate for the planned procedure and anesthesia.   Anselm Bring, MD. [facility] 10/26/2023 7:46 AM@

## 2023-10-26 NOTE — Patient Instructions (Addendum)
 Resume previous diet.                           - Continue present medications.                           - Repeat colonoscopy in 5 years for screening                            purposes d/t FH polyps, prev H/O polyps.                           - The findings and recommendations were discussed                            with the patient's family.   YOU HAD AN ENDOSCOPIC PROCEDURE TODAY AT THE [facility]:   Refer to the procedure report that was given to you for any specific questions about what was found during the examination.  If the procedure report does not answer your questions, please call your gastroenterologist to clarify.  If you requested that your care partner not be given the details of your procedure findings, then the procedure report has been included in a sealed envelope for you to review at your convenience later.  YOU SHOULD EXPECT: Some feelings of bloating in the abdomen. Passage of more gas than usual.  Walking can help get rid of the air that was put into your GI tract during the procedure and reduce the bloating. If you had a lower endoscopy (such as a colonoscopy or flexible sigmoidoscopy) you may notice spotting of blood in your stool or on the toilet paper. If you underwent a bowel prep for your procedure, you may not have a normal bowel movement for a few days.  Please Note:  You might notice some irritation and congestion in your nose or some drainage.  This is from the oxygen used during your procedure.  There is no need for concern and it should clear up in a day or so.  SYMPTOMS TO REPORT IMMEDIATELY:  Following lower endoscopy (colonoscopy or flexible sigmoidoscopy):  Excessive amounts of blood in the stool  Significant tenderness or worsening of abdominal pains  Swelling of the abdomen that is new, acute  Fever of 100F or higher   For urgent or emergent issues, a gastroenterologist can be reached at any hour by calling [phone]. Do not  use MyChart messaging for urgent concerns.    DIET:  We do recommend a small meal at first, but then you may proceed to your regular diet.  Drink plenty of fluids but you should avoid alcoholic beverages for 24 hours.  ACTIVITY:  You should plan to take it easy for the rest of today and you should NOT DRIVE or use heavy machinery until tomorrow (because of the sedation medicines used during the test).    FOLLOW UP: Our staff will call the number listed on your records the next business day following your procedure.  We will call around 7:15- 8:00 am to check on you and address any questions or concerns that you may have regarding the information given to you following your procedure. If we do not reach you, we will leave a message.     If any biopsies were  taken you will be contacted by phone or by letter within the next 1-3 weeks.  Please call us  at [phone] if you have not heard about the biopsies in 3 weeks.    SIGNATURES/CONFIDENTIALITY: You and/or your care partner have signed paperwork which will be entered into your electronic medical record.  These signatures attest to the fact that that the information above on your After Visit Summary has been reviewed and is understood.  Full responsibility of the confidentiality of this discharge information lies with you and/or your care-partner.

## 2023-10-26 NOTE — Op Note (Signed)
 [facility] Patient Name: Johnny Riggs Procedure Date: 10/26/2023 8:07 AM MRN: [id number] Endoscopist: Lynnie Bring , MD, [id number] Age: 48 Referring MD:  Date of Birth: 12-27-1975 Gender: Male Account #: [id number] Procedure:                Colonoscopy Indications:              High risk colon cancer surveillance: Personal                            history of colonic polyps. FH polyps (dad <60) Medicines:                Monitored Anesthesia Care Procedure:                Pre-Anesthesia Assessment:                           - Prior to the procedure, a History and Physical                            was performed, and patient medications and                            allergies were reviewed. The patient's tolerance of                            previous anesthesia was also reviewed. The risks                            and benefits of the procedure and the sedation                            options and risks were discussed with the patient.                            All questions were answered, and informed consent                            was obtained. Prior Anticoagulants: The patient has                            taken no anticoagulant or antiplatelet agents. ASA                            Grade Assessment: I - A normal, healthy patient.                            After reviewing the risks and benefits, the patient                            was deemed in satisfactory condition to undergo the                            procedure.  After obtaining informed consent, the colonoscope                            was passed under direct vision. Throughout the                            procedure, the patient's blood pressure, pulse, and                            oxygen saturations were monitored continuously. The                            [id number]-HQ190L Colonoscope [id number] was introduced                            through the anus and advanced to  the 2 cm into the                            ileum. The colonoscopy was performed without                            difficulty. The patient tolerated the procedure                            well. The quality of the bowel preparation was                            good. The terminal ileum, ileocecal valve,                            appendiceal orifice, and rectum were photographed. Scope In: 8:15:05 AM Scope Out: 8:27:05 AM Scope Withdrawal Time: 0 hours 8 minutes 57 seconds  Total Procedure Duration: 0 hours 12 minutes 0 seconds  Findings:                 The colon (entire examined portion) appeared normal.                           Non-bleeding internal hemorrhoids were found during                            retroflexion. The hemorrhoids were small and Grade                            I (internal hemorrhoids that do not prolapse).                           The terminal ileum appeared normal.                           Retroflexion in the right colon was performed.                           The exam was otherwise without abnormality on  direct and retroflexion views. Complications:            No immediate complications. Estimated Blood Loss:     Estimated blood loss: none. Impression:               - The entire examined colon is normal.                           - Non-bleeding internal hemorrhoids.                           - The examined portion of the ileum was normal.                           - The examination was otherwise normal on direct                            and retroflexion views.                           - No specimens collected. Recommendation:           - Patient has a contact number available for                            emergencies. The signs and symptoms of potential                            delayed complications were discussed with the                            patient. Return to normal activities tomorrow.                             Written discharge instructions were provided to the                            patient.                           - Resume previous diet.                           - Continue present medications.                           - Repeat colonoscopy in 5 years for screening                            purposes d/t FH polyps, prev H/O polyps.                           - The findings and recommendations were discussed                            with the patient's family. Lynnie Bring, MD 10/26/2023 8:33:44 AM This report has been signed electronically.

## 2023-10-29 ENCOUNTER — Telehealth: Payer: Self-pay

## 2023-10-29 NOTE — Telephone Encounter (Signed)
  Follow up Call-     10/26/2023    7:37 AM  Call back number  Post procedure Call Back phone  # [phone]  Permission to leave phone message Yes     Patient questions:  Do you have a fever, pain , or abdominal swelling? No. Pain Score  0 *  Have you tolerated food without any problems? Yes.    Have you been able to return to your normal activities? Yes.    Do you have any questions about your discharge instructions: Diet   No. Medications  No. Follow up visit  No.  Do you have questions or concerns about your Care? No.  Actions: * If pain score is 4 or above: No action needed, pain <4.

## 2023-11-16 DIAGNOSIS — H52223 Regular astigmatism, bilateral: Secondary | ICD-10-CM | POA: Diagnosis not present

## 2023-11-16 DIAGNOSIS — H5213 Myopia, bilateral: Secondary | ICD-10-CM | POA: Diagnosis not present

## 2023-11-16 DIAGNOSIS — H524 Presbyopia: Secondary | ICD-10-CM | POA: Diagnosis not present

## 2023-11-21 ENCOUNTER — Encounter: Admitting: Medical

## 2023-11-28 ENCOUNTER — Encounter: Admitting: Medical

## 2023-12-19 ENCOUNTER — Ambulatory Visit (INDEPENDENT_AMBULATORY_CARE_PROVIDER_SITE_OTHER): Admitting: Medical

## 2023-12-19 VITALS — BP 116/80 | HR 76 | Temp 98.1°F | Resp 14 | Ht 66.0 in | Wt 152.4 lb

## 2023-12-19 DIAGNOSIS — Z0184 Encounter for antibody response examination: Secondary | ICD-10-CM

## 2023-12-19 DIAGNOSIS — E559 Vitamin D deficiency, unspecified: Secondary | ICD-10-CM

## 2023-12-19 DIAGNOSIS — R5383 Other fatigue: Secondary | ICD-10-CM | POA: Diagnosis not present

## 2023-12-19 DIAGNOSIS — E538 Deficiency of other specified B group vitamins: Secondary | ICD-10-CM

## 2023-12-19 DIAGNOSIS — Z23 Encounter for immunization: Secondary | ICD-10-CM | POA: Diagnosis not present

## 2023-12-19 DIAGNOSIS — R739 Hyperglycemia, unspecified: Secondary | ICD-10-CM | POA: Diagnosis not present

## 2023-12-19 DIAGNOSIS — Z0000 Encounter for general adult medical examination without abnormal findings: Secondary | ICD-10-CM | POA: Diagnosis not present

## 2023-12-19 DIAGNOSIS — Z125 Encounter for screening for malignant neoplasm of prostate: Secondary | ICD-10-CM | POA: Diagnosis not present

## 2023-12-19 DIAGNOSIS — Z1159 Encounter for screening for other viral diseases: Secondary | ICD-10-CM

## 2023-12-19 NOTE — Patient Instructions (Addendum)
 For you wellness exam today I have ordered cbc, cmp, tsh, t4 and ipid panel.  Vit d, b12, a1c psa, hep b immunity status and hep c antibody  Future lab fasting  Vaccine given today pcv 20 and shingrix.  Recommend exercise and healthy diet.  We will let you know lab results as they come in.  Follow up date appointment will be determined after lab review.     Preventive Care 29-50 Years Old, Male Preventive care refers to lifestyle choices and visits with your health care provider that can promote health and wellness. Preventive care visits are also called wellness exams. What can I expect for my preventive care visit? Counseling During your preventive care visit, your health care provider may ask about your: Medical history, including: Past medical problems. Family medical history. Current health, including: Emotional well-being. Home life and relationship well-being. Sexual activity. Lifestyle, including: Alcohol, nicotine or tobacco, and drug use. Access to firearms. Diet, exercise, and sleep habits. Safety issues such as seatbelt and bike helmet use. Sunscreen use. Work and work astronomer. Physical exam Your health care provider will check your: Height and weight. These may be used to calculate your BMI (body mass index). BMI is a measurement that tells if you are at a healthy weight. Waist circumference. This measures the distance around your waistline. This measurement also tells if you are at a healthy weight and may help predict your risk of certain diseases, such as type 2 diabetes and high blood pressure. Heart rate and blood pressure. Body temperature. Skin for abnormal spots. What immunizations do I need?  Vaccines are usually given at various ages, according to a schedule. Your health care provider will recommend vaccines for you based on your age, medical history, and lifestyle or other factors, such as travel or where you work. What tests do I  need? Screening Your health care provider may recommend screening tests for certain conditions. This may include: Lipid and cholesterol levels. Diabetes screening. This is done by checking your blood sugar (glucose) after you have not eaten for a while (fasting). Hepatitis B test. Hepatitis C test. HIV (human immunodeficiency virus) test. STI (sexually transmitted infection) testing, if you are at risk. Lung cancer screening. Prostate cancer screening. Colorectal cancer screening. Talk with your health care provider about your test results, treatment options, and if necessary, the need for more tests. Follow these instructions at home: Eating and drinking  Eat a diet that includes fresh fruits and vegetables, whole grains, lean protein, and low-fat dairy products. Take vitamin and mineral supplements as recommended by your health care provider. Do not drink alcohol if your health care provider tells you not to drink. If you drink alcohol: Limit how much you have to 0-2 drinks a day. Know how much alcohol is in your drink. In the [place] one drink equals one 12 oz bottle of beer (355 mL), one 5 oz glass of wine (148 mL), or one 1 oz glass of hard liquor (44 mL). Lifestyle Brush your teeth every morning and night with fluoride toothpaste. Floss one time each day. Exercise for at least 30 minutes 5 or more days each week. Do not use any products that contain nicotine or tobacco. These products include cigarettes, chewing tobacco, and vaping devices, such as e-cigarettes. If you need help quitting, ask your health care provider. Do not use drugs. If you are sexually active, practice safe sex. Use a condom or other form of protection to prevent STIs. Take aspirin only as  told by your health care provider. Make sure that you understand how much to take and what form to take. Work with your health care provider to find out whether it is safe and beneficial for you to take aspirin daily. Find  healthy ways to manage stress, such as: Meditation, yoga, or listening to music. Journaling. Talking to a trusted person. Spending time with friends and family. Minimize exposure to UV radiation to reduce your risk of skin cancer. Safety Always wear your seat belt while driving or riding in a vehicle. Do not drive: If you have been drinking alcohol. Do not ride with someone who has been drinking. When you are tired or distracted. While texting. If you have been using any mind-altering substances or drugs. Wear a helmet and other protective equipment during sports activities. If you have firearms in your house, make sure you follow all gun safety procedures. What's next? Go to your health care provider once a year for an annual wellness visit. Ask your health care provider how often you should have your eyes and teeth checked. Stay up to date on all vaccines. This information is not intended to replace advice given to you by your health care provider. Make sure you discuss any questions you have with your health care provider. Document Revised: 06/23/2020 Document Reviewed: 06/23/2020 Elsevier Patient Education  2024 [employer].

## 2023-12-19 NOTE — Progress Notes (Signed)
 Subjective:    Patient ID: Johnny Riggs, male    DOB: [birth year], 48 y.o.   MRN: [id number]  HPI Expand All Collapse All    Subjective:    Subjective Patient ID: Johnny Riggs, male    DOB: 01/05/1976, 48 y.o.   MRN: [id number]   HPI   Here for wellness exam and is fasting.   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    Ldl has been mildly high in past.    Pt also has history of low vitamin D . No vit d gummy for past 3 months.   No other PMH other than cholesterol and low vitamin D  history.    Dad had hx of high cholesterol and mini strokes   In the past he did start crestor  in past and only took for 2 months.    Pt decided to not take crestor . Pt made a lot of changes to diet.  Cardiac ct score of zero in 2022.   Pt agrees to pcv 20 vaccine and shingrix vaccine today.      Review of Systems  Constitutional:  Negative for chills, fatigue and fever.  Respiratory:  Negative for cough, chest tightness, shortness of breath and wheezing.   Cardiovascular:  Negative for chest pain and palpitations.  Gastrointestinal:  Negative for abdominal pain and constipation.  Genitourinary:  Negative for dysuria, flank pain and frequency.  Musculoskeletal:  Negative for back pain, joint swelling, myalgias and neck stiffness.  Skin:  Negative for rash.  Neurological:  Negative for dizziness, light-headedness, numbness and headaches.  Hematological:  Negative for adenopathy. Does not bruise/bleed easily.  Psychiatric/Behavioral:  Negative for agitation, behavioral problems, confusion and dysphoric mood.     Past Medical History:  Diagnosis Date   Anal fissure    25 years ago    GERD (gastroesophageal reflux disease)    Hyperlipidemia    Low vitamin D  level      Social History   Socioeconomic History   Marital status: Married    Spouse name: Not on file   Number of children: 2   Years of  education: Not on file   Highest education level: Professional school degree (e.g., MD, DDS, DVM, JD)  Occupational History   Occupation: hospitalist  Tobacco Use   Smoking status: Never   Smokeless tobacco: Never  Vaping Use   Vaping status: Never Used  Substance and Sexual Activity   Alcohol use: Not Currently    Comment: occas   Drug use: Never   Sexual activity: Yes  Other Topics Concern   Not on file  Social History Narrative   Not on file   Social Drivers of Health   Financial Resource Strain: Low Risk  (12/19/2023)   Overall Financial Resource Strain (CARDIA)    Difficulty of Paying Living Expenses: Not hard at all  Food Insecurity: No Food Insecurity (12/19/2023)   Hunger Vital Sign    Worried About Running Out of Food in the Last Year: Never true    Ran Out of Food in the Last Year: Never true  Transportation Needs: No Transportation Needs (12/19/2023)   PRAPARE - Administrator, Civil Service (Medical): No    Lack of Transportation (Non-Medical): No  Physical Activity: Insufficiently Active (12/19/2023)   Exercise Vital Sign    Days of Exercise per Week: 3 days    Minutes of Exercise per Session: 40 min  Stress: No Stress Concern Present (12/19/2023)   [employer] of Occupational Health - Occupational Stress Questionnaire    Feeling of Stress: Only a little  Social Connections: Moderately Integrated (12/19/2023)   Social Connection and Isolation Panel    Frequency of Communication with Friends and Family: More than three times a week    Frequency of Social Gatherings with Friends and Family: Once a week    Attends Religious Services: More than 4 times per year    Active Member of [employer] or Organizations: No    Attends Engineer, Structural: Not on file    Marital Status: Married  Catering Manager Violence: Not on file    Past Surgical History:  Procedure Laterality Date   CHOLECYSTECTOMY N/A 03/25/2019   Procedure: LAPAROSCOPIC  CHOLECYSTECTOMY;  Surgeon: Ebbie Cough, MD;  Location: [facility];  Service: General;  Laterality: N/A;   CYST EXCISION N/A 03/11/2018   Procedure: EXCISION OF PERINEAL CYST;  Surgeon: Signe Mitzie LABOR, MD;  Location: [facility];  Service: General;  Laterality: N/A;   ESOPHAGOGASTRODUODENOSCOPY     over 25 years ago. In [place]   NO PAST SURGERIES      Family History  Problem Relation Age of Onset   Diabetes Mother    Irritable bowel syndrome Mother    Colon polyps Father    Esophageal cancer Neg Hx    Colon cancer Neg Hx    Stomach cancer Neg Hx    Rectal cancer Neg Hx     No Known Allergies  Current Outpatient Medications on File Prior to Visit  Medication Sig Dispense Refill   cholecalciferol (VITAMIN D3) 25 MCG (1000 UNIT) tablet Take 2,000 Units by mouth daily.     No current facility-administered medications on file prior to visit.    BP 116/80   Pulse 76   Temp 98.1 F (36.7 C) (Oral)   Resp 14   Ht 5' 6 (1.676 m)   Wt 152 lb 6.4 oz (69.1 kg)   SpO2 99%   BMI 24.60 kg/m        Objective:   Physical Exam  General Mental Status- Alert. General Appearance- Not in acute distress.   Skin General: Color- Normal Color. Moisture- Normal Moisture.  Neck-  No JVD.  Chest and Lung Exam Auscultation: Breath Sounds:-Normal.  Cardiovascular Auscultation:Rythm- Regular. Murmurs & Other Heart Sounds:Auscultation of the heart reveals- No Murmurs.  Abdomen Inspection:-Inspeection Normal. Palpation/Percussion:Note:No mass. Palpation and Percussion of the abdomen reveal- Non Tender, Non Distended + BS, no rebound or guarding.   Neurologic Cranial Nerve exam:- CN III-XII intact(No nystagmus), symmetric smile. Strength:- 5/5 equal and symmetric strength both upper and lower extremities.       Assessment & Plan:   Patient Instructions   For you wellness exam today I have ordered cbc, cmp, tsh, t4 and ipid panel.  Vit d,  b12, psa, hep b immunity status and hep c antibody  Future lab fasting  Vaccine given today pcv 20 and shingrix.  Recommend exercise and healthy diet.  We will let you know lab results as they come in.  Follow up date appointment will be determined after lab review.

## 2023-12-20 ENCOUNTER — Other Ambulatory Visit

## 2023-12-20 DIAGNOSIS — E559 Vitamin D deficiency, unspecified: Secondary | ICD-10-CM

## 2023-12-20 DIAGNOSIS — Z0184 Encounter for antibody response examination: Secondary | ICD-10-CM

## 2023-12-20 DIAGNOSIS — Z0000 Encounter for general adult medical examination without abnormal findings: Secondary | ICD-10-CM | POA: Diagnosis not present

## 2023-12-20 DIAGNOSIS — R739 Hyperglycemia, unspecified: Secondary | ICD-10-CM

## 2023-12-20 DIAGNOSIS — Z125 Encounter for screening for malignant neoplasm of prostate: Secondary | ICD-10-CM | POA: Diagnosis not present

## 2023-12-20 DIAGNOSIS — Z1159 Encounter for screening for other viral diseases: Secondary | ICD-10-CM

## 2023-12-20 DIAGNOSIS — R5383 Other fatigue: Secondary | ICD-10-CM

## 2023-12-20 DIAGNOSIS — Z13220 Encounter for screening for lipoid disorders: Secondary | ICD-10-CM | POA: Diagnosis not present

## 2023-12-20 LAB — HEPATITIS C ANTIBODY: Hepatitis C Ab: NONREACTIVE

## 2023-12-20 LAB — CBC WITH DIFFERENTIAL/PLATELET
Basophils Absolute: 0 K/uL (ref 0.0–0.1)
Basophils Relative: 0.3 % (ref 0.0–3.0)
Eosinophils Absolute: 0 K/uL (ref 0.0–0.7)
Eosinophils Relative: 0.5 % (ref 0.0–5.0)
HCT: 41.5 % (ref 39.0–52.0)
Hemoglobin: 13.8 g/dL (ref 13.0–17.0)
Lymphocytes Relative: 13.9 % (ref 12.0–46.0)
Lymphs Abs: 1.1 K/uL (ref 0.7–4.0)
MCHC: 33.2 g/dL (ref 30.0–36.0)
MCV: 88.8 fl (ref 78.0–100.0)
Monocytes Absolute: 0.7 K/uL (ref 0.1–1.0)
Monocytes Relative: 8.3 % (ref 3.0–12.0)
Neutro Abs: 6.3 K/uL (ref 1.4–7.7)
Neutrophils Relative %: 77 % (ref 43.0–77.0)
Platelets: 244 K/uL (ref 150.0–400.0)
RBC: 4.67 Mil/uL (ref 4.22–5.81)
RDW: 13.4 % (ref 11.5–15.5)
WBC: 8.2 K/uL (ref 4.0–10.5)

## 2023-12-20 LAB — COMPREHENSIVE METABOLIC PANEL WITH GFR
ALT: 15 U/L (ref 0–53)
AST: 16 U/L (ref 0–37)
Albumin: 3.8 g/dL (ref 3.5–5.2)
Alkaline Phosphatase: 76 U/L (ref 39–117)
BUN: 15 mg/dL (ref 6–23)
CO2: 31 meq/L (ref 19–32)
Calcium: 8.6 mg/dL (ref 8.4–10.5)
Chloride: 103 meq/L (ref 96–112)
Creatinine, Ser: 0.92 mg/dL (ref 0.40–1.50)
GFR: 97.18 mL/min (ref >60.00)
Glucose, Bld: 83 mg/dL (ref 70–99)
Potassium: 4.5 meq/L (ref 3.5–5.1)
Sodium: 139 meq/L (ref 135–145)
Total Bilirubin: 0.9 mg/dL (ref 0.2–1.2)
Total Protein: 6 g/dL (ref 6.0–8.3)

## 2023-12-20 LAB — T4, FREE: Free T4: 0.98 ng/dL (ref 0.60–1.60)

## 2023-12-20 LAB — LIPID PANEL
Cholesterol: 177 mg/dL (ref 0–200)
HDL: 35.7 mg/dL — ABNORMAL LOW (ref >39.00)
LDL Cholesterol: 125 mg/dL — ABNORMAL HIGH (ref 0–99)
NonHDL: 141.47
Total CHOL/HDL Ratio: 5
Triglycerides: 82 mg/dL (ref 0.0–149.0)
VLDL: 16.4 mg/dL (ref 0.0–40.0)

## 2023-12-20 LAB — PSA: PSA: 0.86 ng/mL (ref 0.10–4.00)

## 2023-12-20 LAB — VITAMIN D 25 HYDROXY (VIT D DEFICIENCY, FRACTURES): VITD: 10.62 ng/mL — ABNORMAL LOW (ref 30.00–100.00)

## 2023-12-20 LAB — VITAMIN B12: Vitamin B-12: 145 pg/mL — ABNORMAL LOW (ref 211–911)

## 2023-12-20 LAB — TSH: TSH: 1.43 u[IU]/mL (ref 0.35–5.50)

## 2023-12-20 LAB — HEMOGLOBIN A1C: Hgb A1c MFr Bld: 5.8 % (ref 4.6–6.5)

## 2023-12-20 LAB — HEPATITIS B SURFACE ANTIBODY, QUANTITATIVE: Hep B S AB Quant (Post): 353 m[IU]/mL (ref >=10)

## 2023-12-22 ENCOUNTER — Other Ambulatory Visit (HOSPITAL_COMMUNITY): Payer: Self-pay

## 2023-12-22 ENCOUNTER — Ambulatory Visit: Payer: Self-pay | Admitting: Medical

## 2023-12-22 MED ORDER — VITAMIN D (ERGOCALCIFEROL) 1.25 MG (50000 UNIT) PO CAPS
50000.0000 [IU] | ORAL_CAPSULE | ORAL | 0 refills | Status: DC
  Filled 2023-12-22: qty 8, 56d supply, fill #0

## 2023-12-22 NOTE — Addendum Note (Signed)
 Addended by: DORINA DALLAS HERO on: 12/22/2023 08:35 AM   Modules accepted: Orders

## 2023-12-24 ENCOUNTER — Other Ambulatory Visit (HOSPITAL_COMMUNITY): Payer: Self-pay

## 2023-12-24 MED ORDER — CYANOCOBALAMIN 1000 MCG/ML IJ SOLN
INTRAMUSCULAR | 0 refills | Status: AC
  Filled 2023-12-24: qty 6, 84d supply, fill #0

## 2023-12-24 MED ORDER — VITAMIN D (ERGOCALCIFEROL) 1.25 MG (50000 UNIT) PO CAPS
50000.0000 [IU] | ORAL_CAPSULE | ORAL | 0 refills | Status: AC
  Filled 2023-12-24 – 2023-12-26 (×2): qty 8, 56d supply, fill #0

## 2023-12-24 MED ORDER — SYRINGE 25G X 5/8" 3 ML MISC
0 refills | Status: AC
  Filled 2023-12-24: qty 15, 15d supply, fill #0

## 2023-12-25 ENCOUNTER — Other Ambulatory Visit (HOSPITAL_COMMUNITY): Payer: Self-pay

## 2023-12-26 ENCOUNTER — Other Ambulatory Visit (HOSPITAL_COMMUNITY): Payer: Self-pay

## 2024-01-09 ENCOUNTER — Other Ambulatory Visit (HOSPITAL_COMMUNITY): Payer: Self-pay

## 2024-01-09 MED ORDER — PREDNISONE 20 MG PO TABS
40.0000 mg | ORAL_TABLET | Freq: Every day | ORAL | 0 refills | Status: AC
  Filled 2024-01-09: qty 10, 5d supply, fill #0

## 2024-01-09 MED ORDER — ERYTHROMYCIN BASE 500 MG PO TABS
500.0000 mg | ORAL_TABLET | Freq: Every day | ORAL | 0 refills | Status: AC
  Filled 2024-01-09: qty 5, 5d supply, fill #0

## 2024-02-05 ENCOUNTER — Other Ambulatory Visit

## 2024-02-05 DIAGNOSIS — E559 Vitamin D deficiency, unspecified: Secondary | ICD-10-CM

## 2024-02-05 DIAGNOSIS — E538 Deficiency of other specified B group vitamins: Secondary | ICD-10-CM | POA: Diagnosis not present

## 2024-02-05 LAB — VITAMIN D 25 HYDROXY (VIT D DEFICIENCY, FRACTURES): VITD: 31.47 ng/mL (ref 30.00–100.00)

## 2024-02-05 LAB — VITAMIN B12: Vitamin B-12: 1453 pg/mL — ABNORMAL HIGH (ref 211–911)

## 2024-02-05 NOTE — Addendum Note (Signed)
 Addended by: DORINA DALLAS DORINA PA-C M on: 02/05/2024 07:42 PM   Modules accepted: Orders
